# Patient Record
Sex: Female | Born: 1965
Health system: Southern US, Community
[De-identification: ages and names within clinical notes are randomized; demographics above are authoritative.]

## PROBLEM LIST (undated history)

## (undated) DIAGNOSIS — F419 Anxiety disorder, unspecified: Secondary | ICD-10-CM

## (undated) DIAGNOSIS — R630 Anorexia: Secondary | ICD-10-CM

## (undated) DIAGNOSIS — K219 Gastro-esophageal reflux disease without esophagitis: Secondary | ICD-10-CM

## (undated) DIAGNOSIS — F32A Depression, unspecified: Secondary | ICD-10-CM

## (undated) DIAGNOSIS — F509 Eating disorder, unspecified: Secondary | ICD-10-CM

## (undated) DIAGNOSIS — M810 Age-related osteoporosis without current pathological fracture: Secondary | ICD-10-CM

## (undated) HISTORY — PX: COLECTOMY: SHX59

## (undated) HISTORY — PX: SPINE SURGERY: SHX786

## (undated) HISTORY — DX: Anxiety disorder, unspecified: F41.9

## (undated) HISTORY — DX: Age-related osteoporosis without current pathological fracture: M81.0

## (undated) HISTORY — DX: Anorexia: R63.0

## (undated) HISTORY — DX: Gastro-esophageal reflux disease without esophagitis: K21.9

## (undated) HISTORY — DX: Depression, unspecified: F32.A

## (undated) HISTORY — PX: CERVICAL FUSION: SHX112

## (undated) HISTORY — DX: Eating disorder, unspecified: F50.9

---

## 1997-02-13 DIAGNOSIS — K5939 Other megacolon: Secondary | ICD-10-CM | POA: Insufficient documentation

## 1998-09-16 ENCOUNTER — Encounter: Payer: Self-pay | Admitting: Internal Medicine

## 1998-09-17 ENCOUNTER — Ambulatory Visit (HOSPITAL_COMMUNITY): Admission: RE | Admit: 1998-09-17 | Discharge: 1998-09-17 | Payer: Self-pay | Admitting: Internal Medicine

## 1999-07-02 ENCOUNTER — Encounter (INDEPENDENT_AMBULATORY_CARE_PROVIDER_SITE_OTHER): Payer: Self-pay | Admitting: *Deleted

## 1999-07-02 ENCOUNTER — Encounter: Payer: Self-pay | Admitting: Family Medicine

## 1999-07-02 ENCOUNTER — Ambulatory Visit (HOSPITAL_COMMUNITY): Admission: RE | Admit: 1999-07-02 | Discharge: 1999-07-02 | Payer: Self-pay | Admitting: Family Medicine

## 2000-11-29 ENCOUNTER — Encounter: Payer: Self-pay | Admitting: *Deleted

## 2000-11-29 ENCOUNTER — Encounter: Admission: RE | Admit: 2000-11-29 | Discharge: 2000-11-29 | Payer: Self-pay | Admitting: *Deleted

## 2001-11-28 ENCOUNTER — Other Ambulatory Visit: Admission: RE | Admit: 2001-11-28 | Discharge: 2001-11-28 | Payer: Self-pay | Admitting: Obstetrics and Gynecology

## 2002-12-05 ENCOUNTER — Other Ambulatory Visit: Admission: RE | Admit: 2002-12-05 | Discharge: 2002-12-05 | Payer: Self-pay | Admitting: Obstetrics and Gynecology

## 2002-12-08 ENCOUNTER — Encounter: Admission: RE | Admit: 2002-12-08 | Discharge: 2002-12-08 | Payer: Self-pay | Admitting: Obstetrics and Gynecology

## 2002-12-08 ENCOUNTER — Encounter: Payer: Self-pay | Admitting: Obstetrics and Gynecology

## 2003-12-10 ENCOUNTER — Other Ambulatory Visit: Admission: RE | Admit: 2003-12-10 | Discharge: 2003-12-10 | Payer: Self-pay | Admitting: Obstetrics and Gynecology

## 2004-04-07 ENCOUNTER — Ambulatory Visit (HOSPITAL_COMMUNITY): Admission: RE | Admit: 2004-04-07 | Discharge: 2004-04-07 | Payer: Self-pay | Admitting: Internal Medicine

## 2004-04-08 ENCOUNTER — Encounter: Payer: Self-pay | Admitting: Internal Medicine

## 2004-04-14 ENCOUNTER — Ambulatory Visit (HOSPITAL_COMMUNITY): Admission: RE | Admit: 2004-04-14 | Discharge: 2004-04-14 | Payer: Self-pay | Admitting: Internal Medicine

## 2004-04-14 ENCOUNTER — Encounter: Payer: Self-pay | Admitting: Internal Medicine

## 2004-04-24 ENCOUNTER — Encounter: Payer: Self-pay | Admitting: Internal Medicine

## 2004-04-24 ENCOUNTER — Ambulatory Visit (HOSPITAL_COMMUNITY): Admission: RE | Admit: 2004-04-24 | Discharge: 2004-04-24 | Payer: Self-pay | Admitting: Internal Medicine

## 2004-05-29 ENCOUNTER — Ambulatory Visit (HOSPITAL_COMMUNITY): Admission: RE | Admit: 2004-05-29 | Discharge: 2004-05-29 | Payer: Self-pay | Admitting: Internal Medicine

## 2004-05-29 ENCOUNTER — Encounter: Payer: Self-pay | Admitting: Internal Medicine

## 2004-12-08 ENCOUNTER — Encounter: Admission: RE | Admit: 2004-12-08 | Discharge: 2004-12-08 | Payer: Self-pay | Admitting: Obstetrics and Gynecology

## 2005-06-29 IMAGING — CR DG ABDOMEN ACUTE W/ 1V CHEST
3 series · 3 of 3 positions shown · non-contrast
Comparison: none

CLINICAL DATA: Low abdominal pain for one week.  Constipation for one week.  History of most of the large bowel being removed for megacolon in 3224.  
 ACUTE ABDOMEN SERIES WITH CHEST 
 Flat and erect films of the abdomen are made and show no definite obstruction or perforation of the bowel.  There is a moderate amount of fecal material within the right, left, and sigmoid areas of the colon.  No fecal impaction is seen.  No free air is seen beneath the diaphragm.  
 PA erect film of the chest shows no free air beneath the diaphragm.  No evidence of active disease within the chest.  The heart appears normal. 
 IMPRESSION
 Moderate amounts of fecal material in right, left, and sigmoid colon.  No definite fecal impaction, obstruction, or perforation is seen.  No acute disease in the chest.

[view not recorded (1 of 3)]
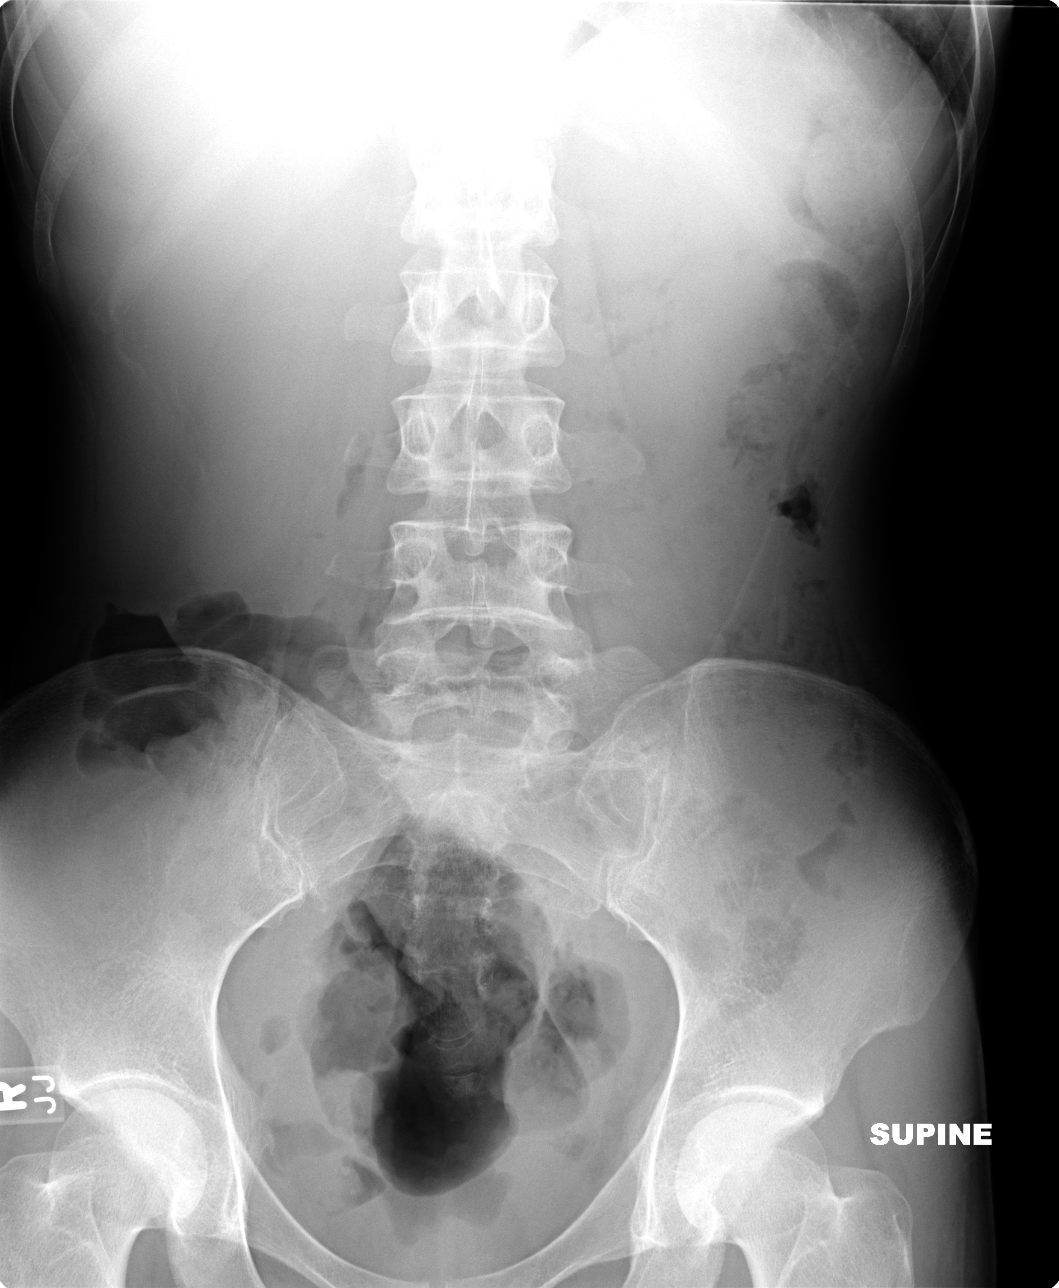

[view not recorded (2 of 3)]
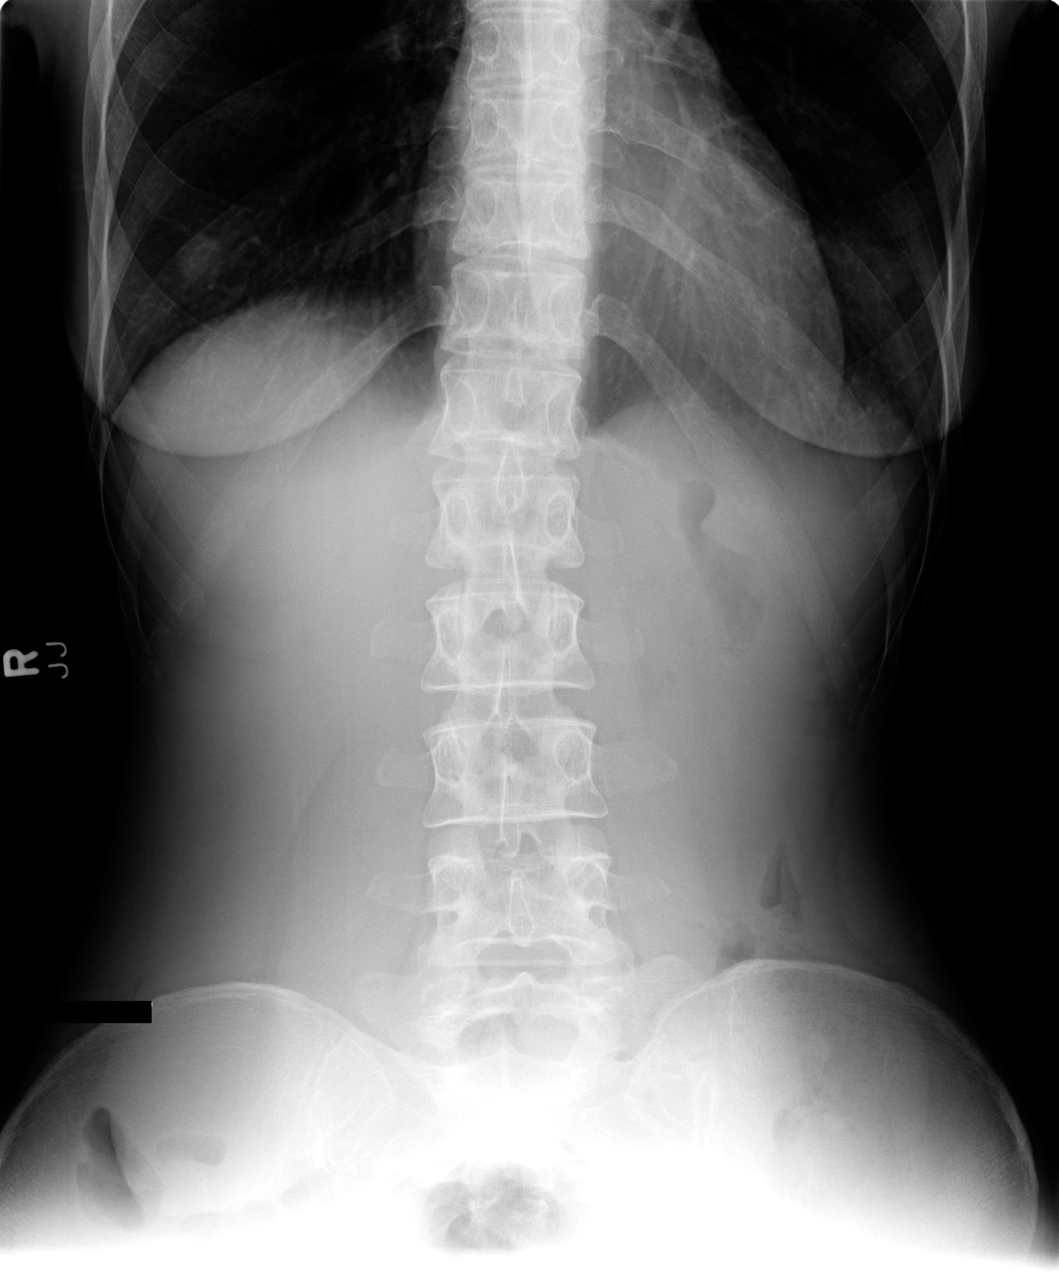

[view not recorded (3 of 3)]
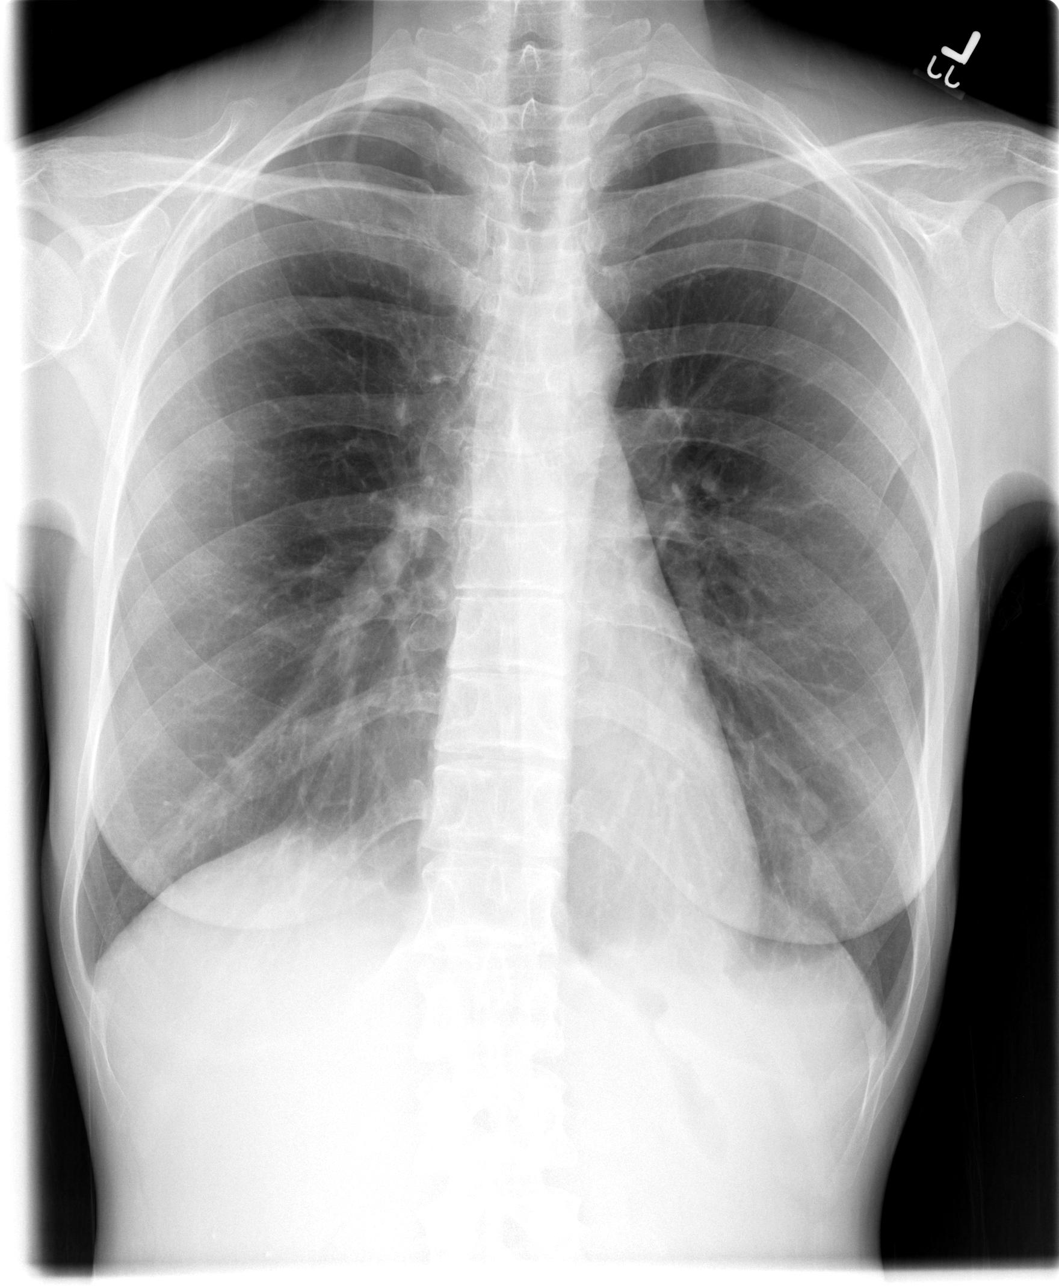

[3 of 3 positions shown; findings below may reference images not displayed]

## 2005-07-06 IMAGING — CR DG ABDOMEN 1V
1 series · 1 of 1 positions shown · non-contrast
Comparison: none

CLINICAL DATA: constipation; Sitz marker capsule followup
 ONE VIEW ABDOMEN 04/14/04.  There are no retained sitz markers seen within the abdomen.  There is retained stool seen within the rectosigmoid colon and the bowel gas pattern is normal.
 IMPRESSION
 No retained sitz markers.

[view not recorded]
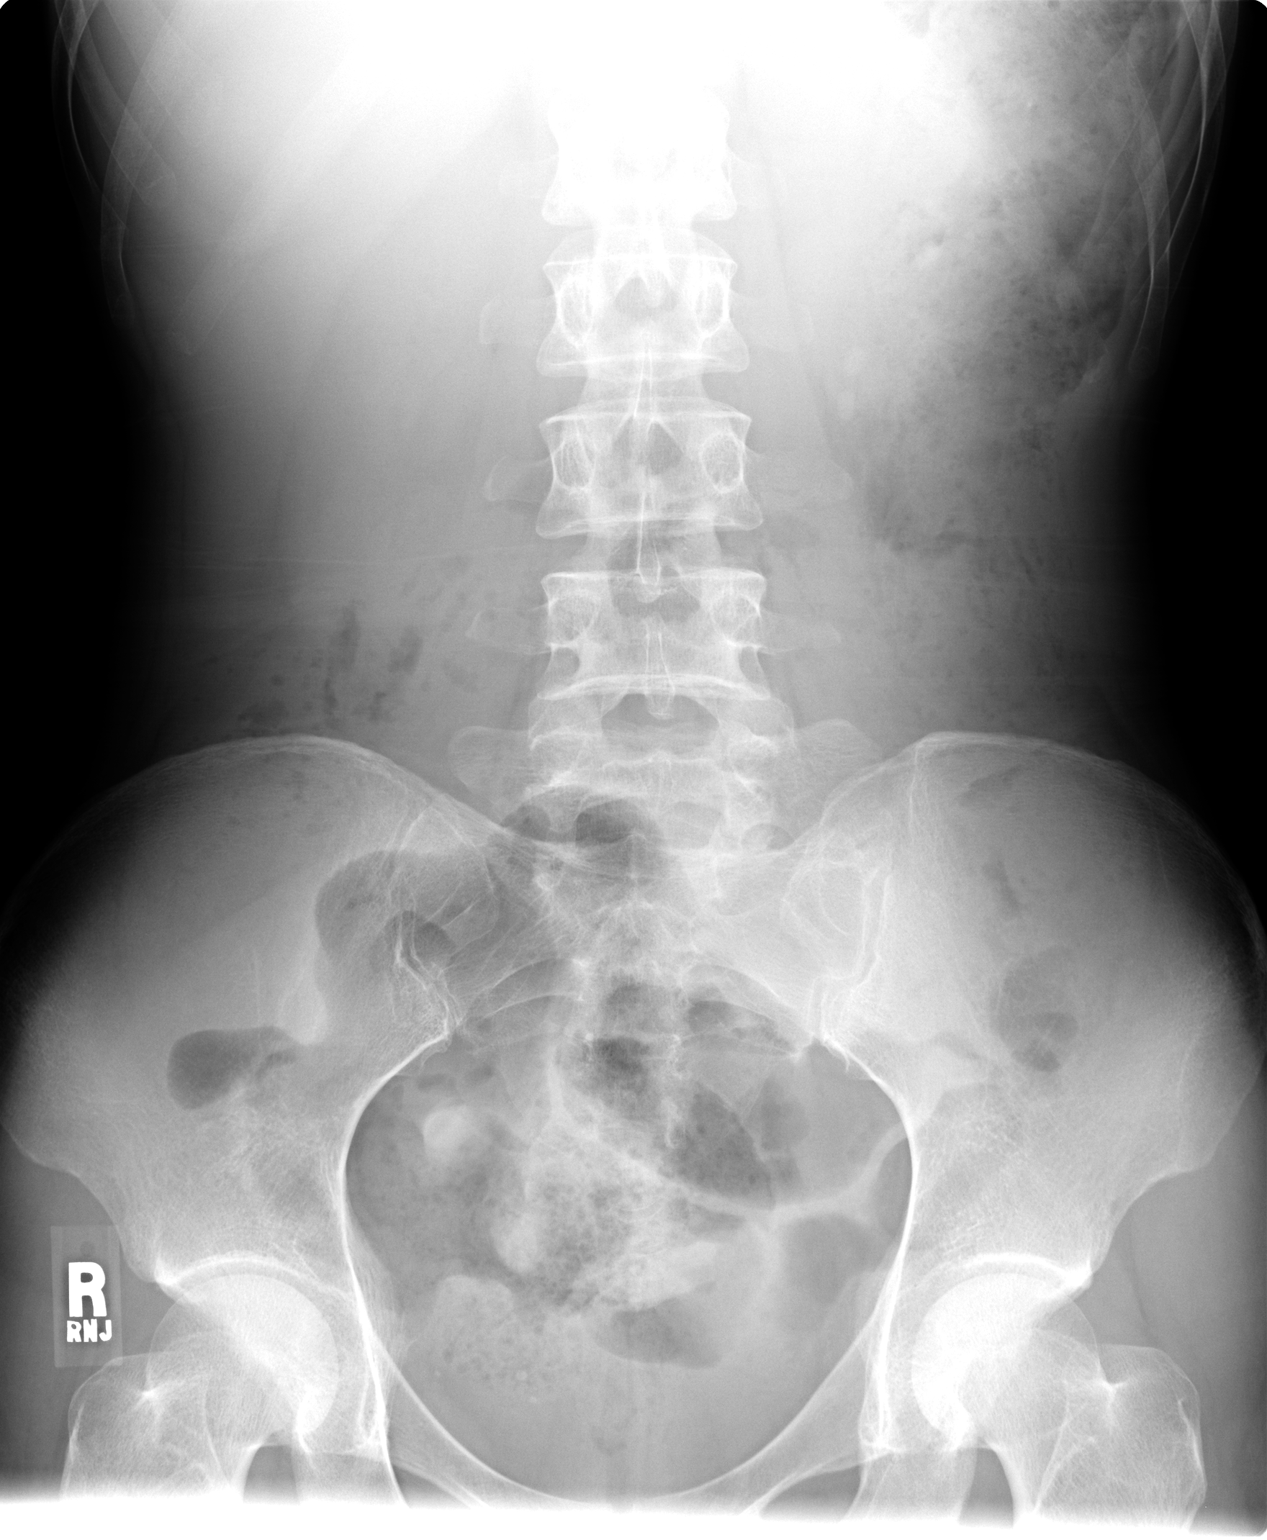

[1 of 1 positions shown; findings below may reference images not displayed]

## 2005-12-17 ENCOUNTER — Encounter: Admission: RE | Admit: 2005-12-17 | Discharge: 2005-12-17 | Payer: Self-pay | Admitting: *Deleted

## 2006-04-02 ENCOUNTER — Ambulatory Visit: Payer: Self-pay | Admitting: Internal Medicine

## 2007-07-04 ENCOUNTER — Ambulatory Visit: Payer: Self-pay | Admitting: Internal Medicine

## 2008-03-30 ENCOUNTER — Encounter (INDEPENDENT_AMBULATORY_CARE_PROVIDER_SITE_OTHER): Payer: Self-pay | Admitting: Family Medicine

## 2008-03-30 ENCOUNTER — Ambulatory Visit: Payer: Self-pay | Admitting: Vascular Surgery

## 2008-03-30 ENCOUNTER — Ambulatory Visit (HOSPITAL_COMMUNITY): Admission: RE | Admit: 2008-03-30 | Discharge: 2008-03-30 | Payer: Self-pay | Admitting: Family Medicine

## 2008-04-09 DIAGNOSIS — F5000 Anorexia nervosa, unspecified: Secondary | ICD-10-CM | POA: Insufficient documentation

## 2008-05-11 ENCOUNTER — Ambulatory Visit: Payer: Self-pay | Admitting: Internal Medicine

## 2008-05-11 ENCOUNTER — Telehealth: Payer: Self-pay | Admitting: Internal Medicine

## 2008-05-11 DIAGNOSIS — K219 Gastro-esophageal reflux disease without esophagitis: Secondary | ICD-10-CM | POA: Insufficient documentation

## 2008-05-11 DIAGNOSIS — M810 Age-related osteoporosis without current pathological fracture: Secondary | ICD-10-CM | POA: Insufficient documentation

## 2008-05-11 DIAGNOSIS — Z8679 Personal history of other diseases of the circulatory system: Secondary | ICD-10-CM | POA: Insufficient documentation

## 2008-05-11 DIAGNOSIS — K6289 Other specified diseases of anus and rectum: Secondary | ICD-10-CM | POA: Insufficient documentation

## 2008-05-11 DIAGNOSIS — K5900 Constipation, unspecified: Secondary | ICD-10-CM | POA: Insufficient documentation

## 2008-05-11 DIAGNOSIS — Z8659 Personal history of other mental and behavioral disorders: Secondary | ICD-10-CM | POA: Insufficient documentation

## 2008-05-15 ENCOUNTER — Telehealth: Payer: Self-pay | Admitting: Internal Medicine

## 2008-05-23 ENCOUNTER — Telehealth: Payer: Self-pay | Admitting: Internal Medicine

## 2008-10-29 ENCOUNTER — Telehealth: Payer: Self-pay | Admitting: Internal Medicine

## 2009-04-22 ENCOUNTER — Telehealth: Payer: Self-pay | Admitting: Internal Medicine

## 2009-08-06 ENCOUNTER — Telehealth: Payer: Self-pay | Admitting: Internal Medicine

## 2009-08-15 ENCOUNTER — Telehealth: Payer: Self-pay | Admitting: Internal Medicine

## 2009-10-03 ENCOUNTER — Telehealth: Payer: Self-pay | Admitting: Internal Medicine

## 2009-11-08 ENCOUNTER — Encounter: Admission: RE | Admit: 2009-11-08 | Discharge: 2009-11-08 | Payer: Self-pay | Admitting: Family Medicine

## 2009-11-25 ENCOUNTER — Encounter: Admission: RE | Admit: 2009-11-25 | Discharge: 2009-11-25 | Payer: Self-pay | Admitting: Neurosurgery

## 2009-12-04 ENCOUNTER — Ambulatory Visit (HOSPITAL_COMMUNITY): Admission: RE | Admit: 2009-12-04 | Discharge: 2009-12-05 | Payer: Self-pay | Admitting: Neurosurgery

## 2009-12-27 ENCOUNTER — Telehealth: Payer: Self-pay | Admitting: Internal Medicine

## 2010-01-06 ENCOUNTER — Encounter: Payer: Self-pay | Admitting: Internal Medicine

## 2010-01-13 ENCOUNTER — Encounter: Payer: Self-pay | Admitting: Internal Medicine

## 2010-01-17 ENCOUNTER — Encounter: Payer: Self-pay | Admitting: Internal Medicine

## 2010-02-11 ENCOUNTER — Encounter: Admission: RE | Admit: 2010-02-11 | Discharge: 2010-02-11 | Payer: Self-pay | Admitting: Neurosurgery

## 2010-04-28 ENCOUNTER — Telehealth: Payer: Self-pay | Admitting: Internal Medicine

## 2010-05-05 ENCOUNTER — Ambulatory Visit: Payer: Self-pay | Admitting: Internal Medicine

## 2010-05-06 LAB — CONVERTED CEMR LAB
Albumin: 4.2 g/dL (ref 3.5–5.2)
BUN: 18 mg/dL (ref 6–23)
Chloride: 93 meq/L — ABNORMAL LOW (ref 96–112)
Creatinine, Ser: 0.6 mg/dL (ref 0.4–1.2)
GFR calc non Af Amer: 121.72 mL/min (ref >60)
Glucose, Bld: 78 mg/dL (ref 70–99)
Phosphorus: 3.5 mg/dL (ref 2.3–4.6)
Vitamin B-12: >1500 pg/mL — ABNORMAL HIGH (ref 211–911)

## 2010-05-14 ENCOUNTER — Encounter: Payer: Self-pay | Admitting: Internal Medicine

## 2010-06-25 ENCOUNTER — Encounter (INDEPENDENT_AMBULATORY_CARE_PROVIDER_SITE_OTHER): Payer: Self-pay | Admitting: *Deleted

## 2010-09-25 ENCOUNTER — Telehealth: Payer: Self-pay | Admitting: Internal Medicine

## 2010-09-25 ENCOUNTER — Encounter: Payer: Self-pay | Admitting: Internal Medicine

## 2010-09-26 ENCOUNTER — Encounter (INDEPENDENT_AMBULATORY_CARE_PROVIDER_SITE_OTHER): Payer: Self-pay | Admitting: *Deleted

## 2010-09-30 ENCOUNTER — Ambulatory Visit: Payer: Self-pay | Admitting: Internal Medicine

## 2010-10-07 ENCOUNTER — Ambulatory Visit: Payer: Self-pay | Admitting: Internal Medicine

## 2010-10-10 ENCOUNTER — Telehealth: Payer: Self-pay | Admitting: Internal Medicine

## 2010-11-18 ENCOUNTER — Ambulatory Visit: Payer: Self-pay | Admitting: Internal Medicine

## 2010-11-18 DIAGNOSIS — K648 Other hemorrhoids: Secondary | ICD-10-CM | POA: Insufficient documentation

## 2010-12-19 ENCOUNTER — Encounter: Payer: Self-pay | Admitting: Internal Medicine

## 2010-12-23 ENCOUNTER — Ambulatory Visit
Admission: RE | Admit: 2010-12-23 | Discharge: 2010-12-23 | Payer: Self-pay | Source: Home / Self Care | Attending: Internal Medicine | Admitting: Internal Medicine

## 2010-12-23 ENCOUNTER — Telehealth: Payer: Self-pay | Admitting: Internal Medicine

## 2010-12-23 ENCOUNTER — Other Ambulatory Visit: Payer: Self-pay | Admitting: Internal Medicine

## 2010-12-23 LAB — RENAL FUNCTION PANEL
Albumin: 4.3 g/dL (ref 3.5–5.2)
BUN: 15 mg/dL (ref 6–23)
CO2: 29 mEq/L (ref 19–32)
Calcium: 10.1 mg/dL (ref 8.4–10.5)
Chloride: 94 mEq/L — ABNORMAL LOW (ref 96–112)
Creatinine, Ser: 0.4 mg/dL (ref 0.4–1.2)
GFR: 168.03 mL/min (ref >60.00)
Glucose, Bld: 81 mg/dL (ref 70–99)
Phosphorus: 3.3 mg/dL (ref 2.3–4.6)
Potassium: 6 mEq/L — ABNORMAL HIGH (ref 3.5–5.1)
Sodium: 131 mEq/L — ABNORMAL LOW (ref 135–145)

## 2010-12-23 LAB — MAGNESIUM: Magnesium: 2 mg/dL (ref 1.5–2.5)

## 2010-12-24 ENCOUNTER — Ambulatory Visit
Admission: RE | Admit: 2010-12-24 | Discharge: 2010-12-24 | Payer: Self-pay | Source: Home / Self Care | Attending: Internal Medicine | Admitting: Internal Medicine

## 2010-12-24 ENCOUNTER — Other Ambulatory Visit: Payer: Self-pay | Admitting: Internal Medicine

## 2010-12-24 ENCOUNTER — Telehealth (INDEPENDENT_AMBULATORY_CARE_PROVIDER_SITE_OTHER): Payer: Self-pay | Admitting: *Deleted

## 2010-12-24 LAB — RENAL FUNCTION PANEL
Albumin: 3.9 g/dL (ref 3.5–5.2)
BUN: 15 mg/dL (ref 6–23)
CO2: 31 mEq/L (ref 19–32)
Calcium: 9.2 mg/dL (ref 8.4–10.5)
Chloride: 93 mEq/L — ABNORMAL LOW (ref 96–112)
Creatinine, Ser: 0.7 mg/dL (ref 0.4–1.2)
GFR: 94.2 mL/min (ref >60.00)
Glucose, Bld: 82 mg/dL (ref 70–99)
Phosphorus: 2.9 mg/dL (ref 2.3–4.6)
Potassium: 4 mEq/L (ref 3.5–5.1)
Sodium: 129 mEq/L — ABNORMAL LOW (ref 135–145)

## 2010-12-26 ENCOUNTER — Telehealth: Payer: Self-pay | Admitting: Internal Medicine

## 2011-01-20 ENCOUNTER — Other Ambulatory Visit: Payer: Self-pay | Admitting: Obstetrics and Gynecology

## 2011-01-20 DIAGNOSIS — Z1239 Encounter for other screening for malignant neoplasm of breast: Secondary | ICD-10-CM

## 2011-01-20 DIAGNOSIS — Z1231 Encounter for screening mammogram for malignant neoplasm of breast: Secondary | ICD-10-CM

## 2011-01-22 NOTE — Miscellaneous (Signed)
Summary: Amitiza Refills  Clinical Lists Changes  Medications: Rx of AMITIZA 24 MCG  CAPS (LUBIPROSTONE) Take 1 tablet by mouth two times a day;  #180 x 0;  Signed;  Entered by: Lamona Curl CMA (AAMA);  Authorized by: Hart Carwin MD;  Method used: Electronically to [facility] , ,   , Ph: [phone], Fax: [phone]    Prescriptions: AMITIZA 24 MCG  CAPS (LUBIPROSTONE) Take 1 tablet by mouth two times a day  #180 x 0   Entered by:   Lamona Curl CMA (AAMA)   Authorized by:   Hart Carwin MD   Signed by:   Lamona Curl CMA (AAMA) on 05/14/2010   Method used:   Electronically to        [facility]* (mail-order)             ,          Ph: [phone]       Fax: [phone]   RxID:   [id number]

## 2011-01-22 NOTE — Assessment & Plan Note (Signed)
Summary: FOLLOW-UP ON TRIAGE 04-28-10            Andrea Wright   History of Present Illness Visit Type: Follow-up Visit Primary GI MD: Lina Sar MD Primary Provider: Dr. Catha Gosselin Requesting Provider: n/a Chief Complaint: generalized abdominal pain and bloating.  Abd pain has subsided for the last few days History of Present Illness:   This is a 45 year old white female with the eating disorder of anorexia, chronic constipation and megacolon. She is status post subtotal colectomy in 1998. Her last sigmoidoscopy was in 2005. She had an acute episode of abdominal pain suggestive a bowel obstruction which resolved spontaneously on bowel rest. She is on a regular anti-constipation regimen which includes MiraLax 17 g daily, Amitiza 24 mcg twice a day, saline enemas once a week. She was evaluated at [facility] in 2007 and gained up to 132 pounds. She is back down 111 pounds today. Patient underwent an incision and evacuation of a thrombosed hemorrhoid by Dr.Wyatt on 04/21/10. Her abdominal pain started 3 days later after she took Vicodin for pain.   GI Review of Systems    Reports abdominal pain, bloating, and  nausea.     Location of  Abdominal pain: generalized.    Denies acid reflux, belching, chest pain, dysphagia with liquids, dysphagia with solids, heartburn, loss of appetite, vomiting, vomiting blood, weight loss, and  weight gain.        Denies anal fissure, black tarry stools, change in bowel habit, constipation, diarrhea, diverticulosis, fecal incontinence, heme positive stool, hemorrhoids, irritable bowel syndrome, jaundice, light color stool, liver problems, rectal bleeding, and  rectal pain. Preventive Screening-Counseling & Management  Alcohol-Tobacco     Smoking Status: never    Current Medications (verified): 1)  Vitamin D 1000 Unit  Tabs (Cholecalciferol) .... Take 1 Tablet By Mouth 1 Time Per Day 2)  Vitamin K .... Take 1 Tablet By Mouth Once A  Day 3)  Magnesium Oxide 400 Mg  Caps (Magnesium Oxide) .... Take 1 Tablet By Mouth Once A Day 4)  Fish Oil   Oil (Fish Oil) .... Take 1 Tablet By Mouth Once A Day 5)  Glucosamine-Chondroitin 750-600 Mg Tabs (Glucosamine-Chondroitin) .... Take 1 Tablet By Mouth Once A Day 6)  Selenium 200 Mcg  Tabs (Selenium) .... Take 1 Tablet By Mouth Once A Day 7)  Vitamin E Blend 200 Unit  Caps (Vitamin E) .... Take 1 Tablet By Mouth Once A Day 8)  Deplin 7.5 Mg  Tabs (L-Methylfolate) .... Take 1 Tablet By Mouth Once A Day 9)  Amitiza 24 Mcg  Caps (Lubiprostone) .... Take 1 Tablet By Mouth Two Times A Day 10)  Miralax   Powd (Polyethylene Glycol 3350) .... Take 1 Scoop Dissolved in At Least 8 Ounces of Water Daily 11)  Biotin 1000 Mcg  Tabs (Biotin) .... Take 1 Tablet By Mouth Once A Day 12)  Multivitamins   Tabs (Multiple Vitamin) .... Take 1 Tablet By Mouth Once A Day 13)  Wellbutrin Sr 150 Mg  Tb12 (Bupropion Hcl) .... 3 Tablets Once Daily 14)  Prozac 20 Mg  Caps (Fluoxetine Hcl) .... 3 Tablets Once Daily 15)  Anusol-Hc 25 Mg  Supp (Hydrocortisone Acetate) .... Insert 1 Suppository Into Rectum At Bedtime 16)  Phillips Milk of Magnesia 7.75 % Susp (Magnesium Hydroxide) .... Take 45 Cc By Mouth Once Daily 17)  Ra Antacid/anti-Gas 200-200-20 Mg/39ml Susp (Alum & Mag Hydroxide-Simeth) .... Use As Directed 18)  Probiotic  Caps (Probiotic Product) .... Take 1 Tablet By Mouth Once A Day 19)  Ranitidine Hcl 150 Mg Tabs (Ranitidine Hcl) .... Take 1 Tablet By Mouth Once A Day 20)  Boniva 150 Mg Tabs (Ibandronate Sodium) .... Take One Tablet By Mouth Once Monthly 21)  Trazodone Hcl 100 Mg Tabs (Trazodone Hcl) .... Take 2 Tablets By Mouth Daily 22)  Ginger 500 Mg Caps (Ginger (Zingiber Officinalis)) .... Take 1 Capsule By Mouth Two Times A Day 23)  Msm 500 Mg Caps (Methylsulfonylmethane) .... Take 1 Capsule By Mouth Once A Day 24)  B Complex  Tabs (B Complex Vitamins) .... Take 1 Tablet By Mouth Once A Day 25)   Calcium 500 Mg Tabs (Calcium) .... Take 1 Tablet By Mouth Once A Day 26)  Lactaid 3000 Unit Tabs (Lactase) .... Take 2 Tablets By Mouth Daily 27)  Mylanta 200-200-20 Mg/24ml Susp (Alum & Mag Hydroxide-Simeth) .... As Needed 28)  Ferrous Sulfate 325 (65 Fe) Mg Tabs (Ferrous Sulfate) 29)  Vitamin C 500 Mg Tabs (Ascorbic Acid) 30)  Vitamin E 400 Unit Caps (Vitamin E)  Allergies (verified): 1)  ! * Ambien Cr  Past History:  Past Medical History: Reviewed history from 04/09/2008 and no changes required. Current Problems:  ANOREXIA, HX OF (ICD-V15.89) MEGACOLON (ICD-564.7)  Past Surgical History: subtotal colectomy  and ileoproctostomy 1998 C-spine surgery  Family History: Family History of Heart Disease: Paternal Grandfather Family History of Colitis/Crohn's:Aunt Family History of Liver Disease: Maternal Grandmother Family History of Pancreatic Cancer: MGF No FH of Colon Cancer:  Social History: Occupation: IT sales professional Alcohol Use - yes Illicit Drug Use - no Patient gets regular exercise. Patient has never smoked.  Daily Caffeine Use coffee/tea Smoking Status:  never  Review of Systems  The patient denies allergy/sinus, anemia, anxiety-new, arthritis/joint pain, back pain, blood in urine, breast changes/lumps, confusion, cough, coughing up blood, depression-new, fainting, fatigue, fever, headaches-new, hearing problems, heart murmur, heart rhythm changes, itching, menstrual pain, muscle pains/cramps, night sweats, nosebleeds, pregnancy symptoms, shortness of breath, skin rash, sleeping problems, sore throat, swelling of feet/legs, swollen lymph glands, thirst - excessive, urination - excessive, urination changes/pain, urine leakage, vision changes, and voice change.         Pertinent positive and negative review of systems were noted in the above HPI. All other ROS was otherwise negative.   Vital Signs:  Patient profile:   45 year old female Height:      69  inches Weight:      111 pounds BMI:     16.45 Pulse rate:   68 / minute Pulse rhythm:   regular BP sitting:   124 / 76  (left arm) Cuff size:   regular  Vitals Entered By: Francee Piccolo CMA Duncan Dull) (May 05, 2010 4:34 PM)  Physical Exam  General:  cachectic. ill appearing, alert and oriented. Eyes:  PERRLA, no icterus. Mouth:  No deformity or lesions, dentition normal. Lungs:  Clear throughout to auscultation. Heart:  Regular rate and rhythm; no murmurs, rubs,  or bruits. Abdomen:  scaphoid abdomen with normal active bowel sounds. No distention. No tympany. No tenderness. There is no palpable stool. Rectal:  small external hemorrhoidal tags, normal rectal sphincter tone, stool is soft Hemoccult negative. There is no tenderness or bleeding. Extremities:  No clubbing, cyanosis, edema or deformities noted. Neurologic:  Alert and oriented x4; grossly normal neurologically. Skin:  Intact without significant lesions or rashes. Psych:  Alert and cooperative. Normal mood and affect.  Impression & Recommendations:  Problem # 1:  ANOREXIA, HX OF (ICD-V15.89) Patient has anorexia and eating disorder. She is currently down to 111 pounds. She denies skipping meals. We will check her pre-albumin, tissue transglutaminase and B12 levels as well as potassium. I encouraged her to call us if she develops the abdominal pain again which suggests a partial small bowel obstruction. In general, she needs to split her MiraLax and take 2 doses to prevent distention of the small bowel. She is due for a repeat sigmoidoscopy as her last exam was done 6 years ago. We need to rule out an anastomotic stricture.  Problem # 2:  RECTAL PAIN (YNW-295.62) Patient is status post evacuation of a thrombosed hemorrhoid by Dr Lindie Spruce, which is currently under good control. Orders: T-Tissue Transglutamase Ab IgA [id number]) T-Prealbumin (96295) TLB-B12, Serum-Total ONLY (28413-K44) TLB-Renal Function Panel  (80069-RENAL)  Problem # 3:  Hx of PSUEDO OBSTRUCTION (ICD-556.6) Patient is status post subtotal colectomy in 1999. Orders: T-Tissue Transglutamase Ab IgA [phone]) T-Prealbumin (44034) TLB-B12, Serum-Total ONLY (74259-D63) TLB-Renal Function Panel (80069-RENAL)  Patient Instructions: 1)  high-fiber diet. 2)  Tissue transglutaminase, prealbumin, renal profile, B12. 3)  MiraLax 17 g daily. 4)  Continue all other laxatives. 5)  Flexible sigmoidoscopy. 6)  Office visit 6 months. 7)  Copy sent to :Dr Wyline Beady 8)  The medication list was reviewed and reconciled.  All changed / newly prescribed medications were explained.  A complete medication list was provided to the patient / caregiver.  Appended Document: FOLLOW-UP ON TRIAGE 04-28-10            Andrea Wright I have contacted the patient about scheduling a flexible sigmoidoscopy (as recommended by Dr Juanda Chance). She states that she will have to call back once she gets her transportation arranged. I have asked her to make sure that she schedules the procedure and I will continue to follow up with her regarding this if I have not gotten a return call back from her shortly.  Appended Document: FOLLOW-UP ON TRIAGE 04-28-10            [facility] Spoke to patient regarding scheduling a flexible sigmoidoscopy. Patient again states "I will have to call you back." I will continue to follow up on this.  Appended Document: FOLLOW-UP ON TRIAGE 04-28-10            [facility] ---- 05/23/2010 8:24 AM, Karen Kitchens Nelson-Smith CMA (AAMA) wrote: Dr Leonard SchwartzLorain Childes.Marland KitchenMarland KitchenMarland KitchenMarland Kitchenpatient still has not scheduled Flex Sig  ---- 05/23/2010 1:42 PM, Hart Carwin MD wrote: please send her recall letter for Flex sig first week in August, 2011.  Recall for August 2011 has been entered into [employer].

## 2011-01-22 NOTE — Medication Information (Signed)
Summary: Scripts for pt/[facility]  Scripts for pt/[facility]   Imported By: Lester [place] 01/22/2010 08:30:50  _____________________________________________________________________  External Attachment:    Type:   Image     Comment:   External Document

## 2011-01-22 NOTE — Progress Notes (Signed)
Summary: Triage   Phone Note Call from Patient Call back at Home Phone [phone]   Caller: Patient Call For: Dr. Juanda Chance Reason for Call: Talk to Nurse Summary of Call: Pt needs to speak with nurse about she did not want to tell me about what Initial call taken by: [place] Johnson,  December 23, 2010 11:52 AM  Follow-up for Phone Call        Patient calling to report she has not had a "solid BM" since last Tuesday. She has increased Miralax to two times a day, Magnesium Citirate x 2 bottles, mineral oil, Amitiza  and water enemas. States she is eating prunes and tried prune lax. States she only passes brown liquid with enemas. Stomach is distended and uncomfortable. She is passing gas. No appetite and feels weak. States she is eating high fiber. Dr. Juanda Chance aware and ordered KUB and lab- renal profile, Magnesium level. Patient will come on in for these today. Follow-up by: Jesse Fall RN,  December 23, 2010 2:26 PM

## 2011-01-22 NOTE — Progress Notes (Signed)
Summary: TRIAGE-Constipation   Phone Note Call from Patient Call back at [phone]   Caller: Patient Call For: Dr.Becki Mccaskill Summary of Call: Patient would like to discuss constipation.  Initial call taken by: Harlow Mares CMA Duncan Dull),  December 27, 2009 3:02 PM  Follow-up for Phone Call        Pt. c/o constipation, passes a small amt of BM daily, but feels like she doesn't empty completly. Does have some abd. cramping. Took a mineral oil enema yesterday. Took mineral oil & MagCitrate at 2pm today. Takes Miralax 17gm daily & Amitiza two times a day. Uses saline enema 1x weekly.  Denies fever, n/v, blood, black stools.  Wants to know if Dr.Jeroline Wolbert has anything more to offer to help w/constipation.  DR.Tamitha Norell PLEASE ADVISE   Follow-up by: Laureen Ochs LPN,  December 27, 2009 3:18 PM  Additional Follow-up for Phone Call Additional follow up Details #1::         I agree with mag. citrate as a last resort. Does she need  KUB to localize the stool impaction. (If present) Fleets enema. MOM 45 cc by mouth once daily in addition to Miralax. Additional Follow-up by: Hart Carwin MD,  December 27, 2009 10:27 PM    Additional Follow-up for Phone Call Additional follow up Details #2::    Above MD orders reviewed with patient. She is passing more stool, doesn't feel constipated at this time. Pt. instructed to call back as needed.  Follow-up by: Laureen Ochs LPN,  December 30, 2009 9:58 AM

## 2011-01-22 NOTE — Procedures (Signed)
Summary: Flexible Sigmoidoscopy  Patient: Andrea Wright Note: All result statuses are Final unless otherwise noted.  Tests: (1) Flexible Sigmoidoscopy (FLX)  FLX Flexible Sigmoidoscopy                             DONE     [facility]     [address]     [place], [place]  [address]           FLEXIBLE SIGMOIDOSCOPY PROCEDURE REPORT           PATIENT:  [name], [name]  MR#:  [id number]     BIRTHDATE:  02/22/1966, 44 yrs. old  GENDER:  female           ENDOSCOPIST:  Hedwig Morton. Juanda Chance, MD     Referred by:  Catha Gosselin, M.D.           PROCEDURE DATE:  10/07/2010     PROCEDURE:  Flexible Sigmoidoscopy, diagnostic     ASA CLASS:  Class II     INDICATIONS:  change in bowel habits, constipation colonic     inertia, s/p subtotal colectomy 1998 with persistant constipation           last exam 2005     pt on Miralax           MEDICATIONS:   Versed 6 mg, Fentanyl 50 mcg           DESCRIPTION OF PROCEDURE:   After the risks benefits and     alternatives of the procedure were thoroughly explained, informed     consent was obtained.  Digital rectal exam was performed and     revealed no rectal masses.   The [id number] endoscope     was introduced through the anus and advanced to the sigmoid colon,     without limitations.  The quality of the prep was poor.  The     instrument was then slowly withdrawn as the mucosa was fully     examined.     <<PROCEDUREIMAGES>>           A postop change was noted rectum to sigmoid s/p subtotal colectomy     with 20 cm remaining colon which appears normal, slightly dilated     Internal hemorrhoids were found (see image5).  The examination was     otherwise normal (see image4, image3, image2, and image1). large     amount of retained stool   Retroflexed views in the rectum     revealed no abnormalities.    The scope was then withdrawn from     the patient and the procedure terminated.           COMPLICATIONS:  None           ENDOSCOPIC  IMPRESSION:     1) Postop change in the rectum to sigmoid     2) Internal hemorrhoids     3) Otherwise normal examination.     wide open ileocolic anastomosis at 20 cm, normal small bowl to     70 cm     RECOMMENDATIONS:     increase Miralax to 17 gm daily           REPEAT EXAM:  In 10 year(s) for.           ______________________________     Hedwig Morton. Juanda Chance, MD           CC:  n.     eSIGNED:   Hedwig Morton. Ankith Edmonston at 10/07/2010 12:00 PM           [place], [place], [id number]  Note: An exclamation mark (!) indicates a result that was not dispersed into the flowsheet. Document Creation Date: 10/07/2010 12:05 PM _______________________________________________________________________  (1) Order result status: Final Collection or observation date-time: 10/07/2010 11:49 Requested date-time:  Receipt date-time:  Reported date-time:  Referring Physician:   Ordering Physician: Lina Sar [id number]) Specimen Source:  Source: Launa Grill Order Number: [id number] Lab site:   Appended Document: Flexible Sigmoidoscopy     Flexible Sigmoidoscopy  Procedure date:  10/07/2010  Comments:      Location: [facility].   Procedures Next Due Date:    Flexible Sigmoidoscopy: 09/2020

## 2011-01-22 NOTE — Letter (Signed)
Summary: Previsit letter  [facility]  [address] [place], [place] [address]   Phone: [phone]  Fax: [phone]       09/25/2010 MRN: [id number]  [facility] [address] [place], [place]  [address]  Dear Andrea Wright,  Welcome to the Gastroenterology Division at [facility].    You are scheduled to see a nurse for your pre-procedure visit on 09/30/10  at 2:00 p.m.  on the 3rd floor at [facility], [address]  We ask that you try to arrive at our office 15 minutes prior to your appointment time to allow for check-in.  Your nurse visit will consist of discussing your medical and surgical history, your immediate family medical history, and your medications.    Please bring a complete list of all your medications or, if you prefer, bring the medication bottles and we will list them.  We will need to be aware of both prescribed and over the counter drugs.  We will need to know exact dosage information as well.  If you are on blood thinners (Coumadin, Plavix, Aggrenox, Ticlid, etc.) please call our office today/prior to your appointment, as we need to consult with your physician about holding your medication.   Please be prepared to read and sign documents such as consent forms, a financial agreement, and acknowledgement forms.  If necessary, and with your consent, a friend or relative is welcome to sit-in on the nurse visit with you.  Please bring your insurance card so that we may make a copy of it.  If your insurance requires a referral to see a specialist, please bring your referral form from your primary care physician.  No co-pay is required for this nurse visit.     If you cannot keep your appointment, please call [phone] to cancel or reschedule prior to your appointment date.  This allows [place] the opportunity to schedule an appointment for another patient in need of care.    Thank you for choosing [facility] for your medical  needs.  We appreciate the opportunity to care for you.  Please visit [place] at our website  to learn more about our practice.                     Sincerely.                                                                                                                   The Gastroenterology Division

## 2011-01-22 NOTE — Letter (Signed)
Summary: [facility]  [address] [place], [place] [address]   Phone: [phone]  Fax: [phone]       Andrea Wright    [birth year]    MRN: [id number]        Procedure Day /Date:  Tuesday 10/07/2010     Arrival Time: 10:00 am      Procedure Time:  11:00 am     Location of Procedure:                    _x _  [facility] (4th Floor)    PREPARATION FOR FLEXIBLE SIGMOIDOSCOPY WITH MAGNESIUM CITRATE  Prior to the day before your procedure, purchase one 8 oz. bottle of Magnesium Citrate and one Fleet Enema from the laxative section of your drugstore.  _________________________________________________________________________________________________  THE DAY BEFORE YOUR PROCEDURE             DATE: Monday 10/17  1.   Have a clear liquid dinner the night before your procedure.  2.   Do not drink anything colored red or purple.  Avoid juices with pulp.  No orange juice.              CLEAR LIQUIDS INCLUDE: Water Jello Ice Popsicles Tea (sugar ok, no milk/cream) Powdered fruit flavored drinks Coffee (sugar ok, no milk/cream) Gatorade Juice: apple, white grape, white cranberry  Lemonade Clear bullion, consomm, broth Carbonated beverages (any kind) Strained chicken noodle soup Hard Candy   3.   At 7:00 pm the night before your procedure, drink one bottle of Magnesium Citrate over ice.  4.   Drink at least 3 more glasses of clear liquids before bedtime (preferably juices).  5.   Results are expected usually within 1 to 6 hours after taking the Magnesium Citrate.  ___________________________________________________________________________________________________  THE DAY OF YOUR PROCEDURE            DATE: Tuesday 10/18  1.   Use Fleet Enema one hour prior to coming for procedure.  2.   You may drink clear liquids until 9:00 am (2 hours before exam)       MEDICATION INSTRUCTIONS  Unless otherwise instructed, you  should take regular prescription medications with a small sip of water as early as possible the morning of your procedure.           OTHER INSTRUCTIONS  You will need a responsible adult at least 46 years of age to accompany you and drive you home.   This person must remain in the waiting room during your procedure.  Wear loose fitting clothing that is easily removed.  Leave jewelry and other valuables at home.  However, you may wish to bring a book to read or an iPod/MP3 player to listen to music as you wait for your procedure to start.  Remove all body piercing jewelry and leave at home.  Total time from sign-in until discharge is approximately 2-3 hours.  You should go home directly after your procedure and rest.  You can resume normal activities the day after your procedure.  The day of your procedure you should not:   Drive   Make legal decisions   Operate machinery   Drink alcohol   Return to work  You will receive specific instructions about eating, activities and medications before you leave.   The above instructions have been reviewed and explained to me by   Ezra Sites RN  September 30, 2010 2:33 PM  I fully understand and can verbalize these instructions _____________________________ Date _________

## 2011-01-22 NOTE — Assessment & Plan Note (Signed)
Summary: ROV per Dr. Delfino Lovett    History of Present Illness Visit Type: Follow-up Visit Primary GI MD: Lina Sar MD Primary Provider: Dr. Catha Gosselin Requesting Provider: n/a Chief Complaint: F/u from flex sig. Pt denies any GI complaints  History of Present Illness:   This is a 45 year old white female with colonic inertia who is status post subtotal colectomy in 1998. She is status post recent sigmoidoscopy showing a normal wide-open illeocolic anastomosis at 20 cm. She has an eating disorder ( anorexia ) and chronic constipation despite os subtotal colectomy.Marland Kitchen She was treated as an inpatient at [facility]  in 2007 and gained 2 0pounds. Recently pt he underwent excision of a thrombosed hemorrhoid ( May 2011) . Her laxative regimen consists of fleets enemas once a week and  MiraLax 17 g daily. Her weight has stabilized  around 113 pounds. She has no complaints today.   GI Review of Systems      Denies abdominal pain, acid reflux, belching, bloating, chest pain, dysphagia with liquids, dysphagia with solids, heartburn, loss of appetite, nausea, vomiting, vomiting blood, weight loss, and  weight gain.        Denies anal fissure, black tarry stools, change in bowel habit, constipation, diarrhea, diverticulosis, fecal incontinence, heme positive stool, hemorrhoids, irritable bowel syndrome, jaundice, light color stool, liver problems, rectal bleeding, and  rectal pain.    Current Medications (verified): 1)  Vitamin D 1000 Unit  Tabs (Cholecalciferol) .... Take 1 Tablet By Mouth 1 Time Per Day 2)  Vitamin K .... Take 1 Tablet By Mouth Once A Day 3)  Magnesium Oxide 400 Mg  Caps (Magnesium Oxide) .... Take 1 Tablet By Mouth Once A Day 4)  Fish Oil   Oil (Fish Oil) .... Take 1 Tablet By Mouth Once A Day 5)  Glucosamine-Chondroitin 750-600 Mg Tabs (Glucosamine-Chondroitin) .... Take 1 Tablet By Mouth Once A Day 6)  Selenium 200 Mcg  Tabs (Selenium) .... Take 1  Tablet By Mouth Once A Day 7)  Vitamin E Blend 200 Unit  Caps (Vitamin E) .... Take 1 Tablet By Mouth Once A Day 8)  Deplin 7.5 Mg  Tabs (L-Methylfolate) .... Take 1 Tablet By Mouth Once A Day 9)  Amitiza 24 Mcg  Caps (Lubiprostone) .... Take 1 Tablet By Mouth Two Times A Day 10)  Miralax   Powd (Polyethylene Glycol 3350) .... Take 1 Scoop Dissolved in At Least 8 Ounces of Water Daily 11)  Biotin 1000 Mcg  Tabs (Biotin) .... Take 1 Tablet By Mouth Once A Day 12)  Qc Daily Multivitamins/iron  Tabs (Multiple Vitamins-Iron) .... One Tablet By Mouth Once Daily 13)  Wellbutrin Sr 150 Mg  Tb12 (Bupropion Hcl) .... 3 Tablets Once Daily 14)  Prozac 20 Mg  Caps (Fluoxetine Hcl) .... 3 Tablets Once Daily 15)  Anusol-Hc 25 Mg  Supp (Hydrocortisone Acetate) .... Insert 1 Suppository Into Rectum At Bedtime 16)  Phillips Milk of Magnesia 7.75 % Susp (Magnesium Hydroxide) .... Take 45 Cc By Mouth Once Daily 17)  Ra Antacid/anti-Gas 200-200-20 Mg/29ml Susp (Alum & Mag Hydroxide-Simeth) .... Use As Directed 18)  Probiotic  Caps (Probiotic Product) .... Take 1 Tablet By Mouth Once A Day 19)  Ranitidine Hcl 150 Mg Tabs (Ranitidine Hcl) .... Take 1 Tablet By Mouth Two Times A Day 20)  Boniva 150 Mg Tabs (Ibandronate Sodium) .... Take One Tablet By Mouth Once Monthly 21)  Trazodone Hcl 100 Mg Tabs (Trazodone Hcl) .... Take  2 Tablets By Mouth Daily 22)  Msm 500 Mg Caps (Methylsulfonylmethane) .... Take 1 Capsule By Mouth Once A Day 23)  B Complex  Tabs (B Complex Vitamins) .... Take 1 Tablet By Mouth Once A Day 24)  Calcium 500 Mg Tabs (Calcium) .... Take 1 Tablet By Mouth Once A Day 25)  Lactaid 3000 Unit Tabs (Lactase) .... Take 2 Tablets By Mouth Daily 26)  Mylanta 200-200-20 Mg/48ml Susp (Alum & Mag Hydroxide-Simeth) .... As Needed 27)  Vitamin C 500 Mg Tabs (Ascorbic Acid) .... One Tablet By Mouth Once Daily 28)  Vitamin E 400 Unit Caps (Vitamin E) .... One Capsule By Mouth Once Daily 29)  Gas-X Prevention  Caps  (Alpha-D-Galactosidase) .... As Needed  Allergies (verified): 1)  ! * Ambien Cr  Past History:  Past Medical History: HEMORRHOIDS, INTERNAL (ICD-455.0) RECTAL PAIN (ICD-569.42) OSTEOPOROSIS (ICD-733.00) RAYNAUD'S SYNDROME, HX OF (ICD-V12.59) DEPRESSION, HX OF (ICD-V11.8) GERD (ICD-530.81) CONSTIPATION (ICD-564.00) Hx of PSUEDO OBSTRUCTION (ICD-556.6) ANOREXIA, HX OF (ICD-V15.89) MEGACOLON (ICD-564.7)  Past Surgical History: Reviewed history from 05/05/2010 and no changes required. subtotal colectomy  and ileoproctostomy 1998 C-spine surgery  Family History: Reviewed history from 05/05/2010 and no changes required. Family History of Heart Disease: Paternal Grandfather Family History of Colitis/Crohn's:Aunt Family History of Liver Disease: Maternal Grandmother Family History of Pancreatic Cancer: MGF No FH of Colon Cancer:  Social History: Reviewed history from 05/05/2010 and no changes required. Occupation: [employer] Alcohol Use - yes Illicit Drug Use - no Patient gets regular exercise. Patient has never smoked.  Daily Caffeine Use coffee/tea  Review of Systems       The patient complains of back pain.  The patient denies allergy/sinus, anemia, anxiety-new, arthritis/joint pain, blood in urine, breast changes/lumps, change in vision, confusion, cough, coughing up blood, depression-new, fainting, fatigue, fever, headaches-new, hearing problems, heart murmur, heart rhythm changes, itching, menstrual pain, muscle pains/cramps, night sweats, nosebleeds, pregnancy symptoms, shortness of breath, skin rash, sleeping problems, sore throat, swelling of feet/legs, swollen lymph glands, thirst - excessive , urination - excessive , urination changes/pain, urine leakage, vision changes, and voice change.         Pertinent positive and negative review of systems were noted in the above HPI. All other ROS was otherwise negative.   Vital Signs:  Patient profile:    45 year old female Height:      69 inches Weight:      113 pounds BMI:     16.75 BSA:     1.62 Pulse rate:   72 / minute Pulse rhythm:   regular BP sitting:   116 / 64  (left arm) Cuff size:   regular  Vitals Entered By: Ok Anis CMA (November 18, 2010 3:59 PM)   Impression & Recommendations:  Problem # 1:  HEMORRHOIDS, INTERNAL (ICD-455.0) Patient currently has no symptoms.  Problem # 2:  CONSTIPATION (ICD-564.00) Patient is to continue the same laxative regimen which consists of fleets enemas and MiraLax as well as a high-fiber diet and Amitiza 24 ug two times a day.  Problem # 3:  ANOREXIA, HX OF (ICD-V15.89) Paient's weight is stable at 113 pounds. We have discussed extensively increasing her caloric intake and adding SlimFast to her diet. She needs to eat at least 1200 calories a day.She has not seen a counselor recently. 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  Patient Instructions: 1)  We have sent a prescription for Amitiza to [facility].  If you have not recieved your prescription within 1-2 weeks, please let us know so we can look into it. 2)  MiraLax 17 g daily. 3)  Fleets enema weekly. 4)  Increase caloric intake to 1200 calories a day by adding additional SlimFast. 5)  Copy sent to : Dr Catha Gosselin Prescriptions: AMITIZA 24 MCG  CAPS (LUBIPROSTONE) Take 1 tablet by mouth two times a day  #180 x 1   Entered by:   Lamona Curl CMA (AAMA)   Authorized by:   Hart Carwin MD   Signed by:   Lamona Curl CMA (AAMA) on 11/18/2010   Method used:   Electronically to        [facility]* (retail)             ,          Ph: [phone]       Fax: [phone]   RxID:   [id number]

## 2011-01-22 NOTE — Miscellaneous (Signed)
Summary: [facility]  Clinical Lists Changes  Allergies: Changed allergy or adverse reaction from * AMBIEN CR to * AMBIEN CR

## 2011-01-22 NOTE — Medication Information (Signed)
Summary: Letter from patient requesting Rx  Letter from patient requesting Rx   Imported By: Sherian Rein 01/16/2010 14:10:11  _____________________________________________________________________  External Attachment:    Type:   Image     Comment:   External Document  Appended Document: Letter from patient requesting Rx please make a statement  to approve use of pt's OTC meds. Andrea Wright, thanks for care You have provided to Andrea Wright  Appended Document: Letter from patient requesting Rx Scripts mailed to pt., message left for [name] to make her aware. Pt. instructed to call back as needed.

## 2011-01-22 NOTE — Progress Notes (Signed)
Summary: TRIAGE   Phone Note Call from Patient Call back at Work Phone [phone] Call back at CELL# [phone]   Caller: Patient Call For: Dr. Juanda Chance Reason for Call: Talk to Nurse Summary of Call: Pt.would like to discuss severe abd pain she experienced over the weekend. Initial call taken by: Vallarie Mare,  Apr 28, 2010 10:45 AM  Follow-up for Phone Call        Last OV was 05-11-2008.  Pt. had an episode of severe abd.pain on Thursday, woke her up during the night.  States, "I felt like I couldn't breath, my stomache was so swollen and it hurt. It didn't stop until Saturday. It was horrible!"  She took Pamprin, went on liquids, feels better today.  Also, she had a thrombosed hemorrhoid treated by Dr.Wyatt  at [facility] on 04-21-10.  1) See Dr.Marisol Glazer on 05-05-10 at 4pm 2) Soft,bland diet. No spicy,greasy,fried foods. Advance diet as tolerated. 3) Gas-x,Phazyme, etc. as needed for gas & bloating. 4) tylenol/Ibuprofen as needed 5) If symptoms become worse call back immediately or go to ER. 6) I will call pt., if new orders, after MD reviews.  Follow-up by: Laureen Ochs LPN,  Apr 29, 931 12:09 PM  Additional Follow-up for Phone Call Additional follow up Details #1::        reviewed and agree. Additional Follow-up by: Hart Carwin MD,  Apr 28, 2010 8:27 PM

## 2011-01-22 NOTE — Progress Notes (Signed)
   Phone Note Outgoing Call Call back at [facility] Phone [phone]   Call placed by: Jesse Fall, RN Call placed to: Patient Summary of Call: Discussed with Dr. Juanda Chance lab results from Renal Profile today. Per Dr. Juanda Chance- have patient stop laxatives. Use only tap water or fleet's enema. Send a copy of labs to Dr. Fredirick Maudlin office. Called and spoke with patient re: Dr. Regino Schultze recommendation. Called Dr. Fredirick Maudlin office and spoke with Noreene Larsson. Gave her Dr. Regino Schultze recommendation for patient and faxed a copy of labs to their office. Initial call taken by: Jesse Fall RN,  December 24, 2010 4:20 PM

## 2011-01-22 NOTE — Progress Notes (Signed)
Summary: procedure ?'s   Phone Note Call from Patient Call back at Work Phone [phone]   Caller: Patient Call For: Dr. Juanda Chance Reason for Call: Talk to Nurse Summary of Call: would like to discuss indetail the "test" that Dr. Juanda Chance told pt she needs... pt does not recall test name or reason Initial call taken by: Vallarie Mare,  September 25, 2010 9:22 AM  Follow-up for Phone Call        Message left for patient to call back. Jesse Fall RN  September 25, 2010 10:52 AM  patient recieved a recall notice for a flex.  All questions answered.  Flex scheduled for 10/07/10 11:00, pre-visit 09/30/10 2:00 Follow-up by: Darcey Nora RN, CGRN,  September 25, 2010 3:23 PM

## 2011-01-22 NOTE — Progress Notes (Signed)
Summary: speak to nurse   Phone Note Call from Patient Call back at Home Phone [phone]   Caller: Patient Call For: Dr Juanda Chance Reason for Call: Talk to Nurse Summary of Call: Patient wants to speak directly to nurse Initial call taken by: Tawni Levy,  December 26, 2010 8:23 AM  Follow-up for Phone Call        Patient calling to clarify what she can take for constipation. Explained to patient that Dr. Juanda Chance wants her to use tap water enemas or Fleets enemas. She will try this but states she is having hemorrhoid problems also. She is using suppositories that are helping with this. She also wants to know if she should stop the Amitiza. Please, advise.  Additional Follow-up for Phone Call Additional follow up Details #1::        It is not just the Amtiza. It is that she started using large anmount of the laxatives thinking that she was costipated. All of the above ( Mag Oxide, MOM, Miralax, Amitiza) caused her electrolytes to get out of control. She should stgick with her usual regimen , not deviate from it. She may use  enema to clear the lower colon if neccessary but don't increase any oral laxatives. Please check electrolytes q 2 weeks till stable. We can do it or her PCP can do iy. Additional Follow-up by: Hart Carwin MD,  December 27, 2010 7:28 PM    Additional Follow-up for Phone Call Additional follow up Details #2::    Patient given Dr. Juanda Chance recommendations. She states she understands. She states she will follow up with Dr. Clarene Duke to get labs rechecked. Patient seeing Dr. Rosalio Macadamia tomorrow and requested labs be faxed to her office. Labs faxed per patient request. Follow-up by: Jesse Fall RN,  December 29, 2010 10:59 AM

## 2011-01-22 NOTE — Letter (Signed)
Summary: Colonoscopy Date Change Letter  [facility]  [address] [place], [place] [address]   Phone: [phone]  Fax: [phone]      June 25, 2010 MRN: [id number]   [facility] [address] [place], [place]  [address]   Dear Ms. Demartini,   Previously you were recommended to have a repeat Flexible Sigmoidoscopy around this time. Your chart was recently reviewed by Dr. Hedwig Morton. Juanda Chance of [facility]. Follow up Flexible Sigmoidoscopy is now recommended in January 2016. This revised recommendation is based on current, nationally recognized guidelines for colorectal cancer screening and polyp surveillance. These guidelines are endorsed by the American Cancer Society, [employer] on Colorectal Cancer as well as numerous other major medical organizations.  Please understand that our recommendation assumes that you do not have any new symptoms such as bleeding, a change in bowel habits, anemia, or significant abdominal discomfort. If you do have any concerning GI symptoms or want to discuss the guideline recommendations, please call to arrange an office visit at your earliest convenience. Otherwise we will keep you in our reminder system and contact you 1-2 months prior to the date listed above to schedule your next colonoscopy.  Thank you,  Hedwig Morton. Juanda Chance, M.D.  [facility] [phone]  Appended Document: Colonoscopy Date Change Letter  ---- 07/29/2010 12:57 PM, Lamona Curl CMA Duncan Dull) wrote: Ms Lightsey came to see [place] on 05/05/10 of an office visit after having abdominal pain and bloating....Marland Kitchenat that visit, you recommended that she have a flex sig as it had been 6 years since her last one. She put the procedure off (I called to remind her a couple of times) so I flagged you and you asked me to put in a recall for August 2011. However, looks like the patient was sent a "flexible sigmoidoscopy  date change letter" saying that she is no longer due for her procedure until 2016....Marland Kitchenis she truly ok until 2016 or do I need to follow up with her?  ---- 07/29/2010 1:03 PM, Hart Carwin MD wrote: no need to follow up with her. thanks

## 2011-01-22 NOTE — Progress Notes (Signed)
Summary: Medication refill   Phone Note Call from Patient Call back at Home Phone [phone]   Caller: Patient Call For: Dr. Juanda Chance Reason for Call: Refill Medication Summary of Call: Refill on her Anusol and requesting phone call before it is called in Initial call taken by: Karna Christmas,  October 10, 2010 8:56 AM  Follow-up for Phone Call        Patient request refill on Anusol suppositories. I have sent that to patient's pharmacy. Patient also states that since her colonoscopy, she has been having some increased indigestion. She takes Zantac OTC but this has not helped this time. I have advised her to take prilosec OTC (up to 2 tablets=40 mg) in the morning and a Zantac at night for 1 week. She is to follow antireflux measures and stay on a bland diet. She will call us back if she does not notice any improvement in her symptoms. Follow-up by: Lamona Curl CMA Duncan Dull),  October 10, 2010 11:37 AM    Prescriptions: ANUSOL-HC 25 MG  SUPP (HYDROCORTISONE ACETATE) Insert 1 suppository into rectum at bedtime  #12 x 1   Entered by:   Lamona Curl CMA (AAMA)   Authorized by:   Hart Carwin MD   Signed by:   Lamona Curl CMA (AAMA) on 10/10/2010   Method used:   Electronically to        [facility]. [phone]* (retail)       [address]       [place], [place]  [address]       Ph: [phone]       Fax: [phone]   RxID:   [id number]

## 2011-01-22 NOTE — Miscellaneous (Signed)
Summary: Rx's  See note scanned into EMR. Hortense Ramal CMA Duncan Dull)  January 13, 2010 11:36 AM  Clinical Lists Changes  Medications: Added new medication of PHILLIPS MILK OF MAGNESIA 7.75 % SUSP (MAGNESIUM HYDROXIDE) Take 45 cc by mouth once daily - Signed Added new medication of FLEET MINERAL OIL  ENEM (MINERAL OIL) Use as directed - Signed Added new medication of RA ANTACID/ANTI-GAS 200-200-20 MG/5ML SUSP (ALUM & MAG HYDROXIDE-SIMETH) Use as directed - Signed Added new medication of MIRALAX  POWD (POLYETHYLENE GLYCOL 3350) Take as directed - Signed Added new medication of ALIGN  CAPS (PROBIOTIC PRODUCT) Take 1 tablet by mouth once a day - Signed Added new medication of RANITIDINE HCL 150 MG TABS (RANITIDINE HCL) Take 1 tablet by mouth once a day - Signed Rx of PHILLIPS MILK OF MAGNESIA 7.75 % SUSP (MAGNESIUM HYDROXIDE) Take 45 cc by mouth once daily;  #1 bottle x 5;  Signed;  Entered by: Hortense Ramal CMA (AAMA);  Authorized by: Hart Carwin MD;  Method used: Print then Mail to Patient Rx of FLEET MINERAL OIL  ENEM (MINERAL OIL) Use as directed;  #30 x 5;  Signed;  Entered by: Hortense Ramal CMA (AAMA);  Authorized by: Hart Carwin MD;  Method used: Print then Mail to Patient Rx of RA ANTACID/ANTI-GAS 200-200-20 MG/5ML SUSP (ALUM & MAG HYDROXIDE-SIMETH) Use as directed;  #1 bottle x 3;  Signed;  Entered by: Hortense Ramal CMA (AAMA);  Authorized by: Hart Carwin MD;  Method used: Print then Mail to Patient Rx of MIRALAX  POWD (POLYETHYLENE GLYCOL 3350) Take as directed;  #255 grams x 5;  Signed;  Entered by: Hortense Ramal CMA (AAMA);  Authorized by: Hart Carwin MD;  Method used: Print then Mail to Patient Rx of ALIGN  CAPS (PROBIOTIC PRODUCT) Take 1 tablet by mouth once a day;  #30 x 5;  Signed;  Entered by: Hortense Ramal CMA (AAMA);  Authorized by: Hart Carwin MD;  Method used: Print then Mail to Patient Rx of RANITIDINE HCL 150 MG TABS (RANITIDINE HCL) Take 1 tablet by mouth once a day;   #30 x 5;  Signed;  Entered by: Hortense Ramal CMA (AAMA);  Authorized by: Hart Carwin MD;  Method used: Print then Mail to Patient    Prescriptions: RANITIDINE HCL 150 MG TABS (RANITIDINE HCL) Take 1 tablet by mouth once a day  #30 x 5   Entered by:   Hortense Ramal CMA (AAMA)   Authorized by:   Hart Carwin MD   Signed by:   Hortense Ramal CMA (AAMA) on 01/13/2010   Method used:   Print then Mail to Patient   RxID:   [id number] ALIGN  CAPS (PROBIOTIC PRODUCT) Take 1 tablet by mouth once a day  #30 x 5   Entered by:   Hortense Ramal CMA (AAMA)   Authorized by:   Hart Carwin MD   Signed by:   Hortense Ramal CMA (AAMA) on 01/13/2010   Method used:   Print then Mail to Patient   RxID:   [id number] MIRALAX  POWD (POLYETHYLENE GLYCOL 3350) Take as directed  #255 grams x 5   Entered by:   Hortense Ramal CMA (AAMA)   Authorized by:   Hart Carwin MD   Signed by:   Hortense Ramal CMA (AAMA) on 01/13/2010   Method used:   Print then Mail to Patient   RxID:   [id number] RA ANTACID/ANTI-GAS 200-200-20 MG/5ML SUSP (  ALUM & MAG HYDROXIDE-SIMETH) Use as directed  #1 bottle x 3   Entered by:   Hortense Ramal CMA (AAMA)   Authorized by:   Hart Carwin MD   Signed by:   Hortense Ramal CMA (AAMA) on 01/13/2010   Method used:   Print then Mail to Patient   RxID:   [id number] FLEET MINERAL OIL  ENEM (MINERAL OIL) Use as directed  #30 x 5   Entered by:   Hortense Ramal CMA (AAMA)   Authorized by:   Hart Carwin MD   Signed by:   Hortense Ramal CMA (AAMA) on 01/13/2010   Method used:   Print then Mail to Patient   RxID:   [id number] PHILLIPS MILK OF MAGNESIA 7.75 % SUSP (MAGNESIUM HYDROXIDE) Take 45 cc by mouth once daily  #1 bottle x 5   Entered by:   Hortense Ramal CMA (AAMA)   Authorized by:   Hart Carwin MD   Signed by:   Hortense Ramal CMA (AAMA) on 01/13/2010   Method used:   Print then Mail to Patient   RxID:   [id number]

## 2011-01-28 ENCOUNTER — Telehealth: Payer: Self-pay | Admitting: Internal Medicine

## 2011-01-29 ENCOUNTER — Ambulatory Visit: Payer: Self-pay

## 2011-02-02 ENCOUNTER — Encounter (INDEPENDENT_AMBULATORY_CARE_PROVIDER_SITE_OTHER): Payer: Self-pay | Admitting: *Deleted

## 2011-02-02 ENCOUNTER — Other Ambulatory Visit: Payer: Self-pay | Admitting: Internal Medicine

## 2011-02-02 ENCOUNTER — Other Ambulatory Visit: Payer: BC Managed Care – PPO

## 2011-02-02 DIAGNOSIS — E878 Other disorders of electrolyte and fluid balance, not elsewhere classified: Secondary | ICD-10-CM

## 2011-02-02 LAB — ELECTROLYTE PANEL: Chloride: 88 mEq/L — ABNORMAL LOW (ref 96–112)

## 2011-02-05 NOTE — Progress Notes (Addendum)
Summary: Med refill   Phone Note Call from Patient Call back at Home Phone [phone]   Caller: Patient Call For: Dr. Juanda Chance Reason for Call: Talk to Nurse Summary of Call: Pt needs new RX for amitiza sent to [facility] pharmacy Initial call taken by: [place] Johnson,  January 28, 2011 2:38 PM  Follow-up for Phone Call        Patient was to have labs done with Dr Catha Gosselin as per Dr Regino Schultze request on 12/26/10 . She states that she did so. Unfortunately we never received those records so I have called to request them before we send refills of medications for her. Follow-up by: Lamona Curl CMA Duncan Dull),  January 29, 2011 8:34 AM  Additional Follow-up for Phone Call Additional follow up Details #1::        Dr Juanda Chance, patient stated that she had labs done with Dr Clarene Duke after our discussion with her on 12-26-10 that she needed labs. However, I have contacted Dr K.Little's office to get results and the last labs they have are from 12-19-10 (before we asked Patient to get repeat labs). I am going to have her come HERE for labs ASAP but do you want me to give her amitiza in the meantime or have her wait until she actually goes for lab draw? Additional Follow-up by: Lamona Curl CMA Duncan Dull),  January 29, 2011 11:58 AM    Additional Follow-up for Phone Call Additional follow up Details #2::    Yes, We need to review her renal profile before refilling her Amitiza. She is a high risk for electrolyte imbalance due to her eating disorder. Follow-up by: Hart Carwin MD,  January 29, 2011 9:48 PM  Additional Follow-up for Phone Call Additional follow up Details #3:: Details for Additional Follow-up Action Taken: I have spoken to patient to advise her that we have contacted Dr Fredirick Maudlin office and that they do not have any labs on file since 12/19/10. Therefore, she needs to have labs completed in our office before we give her refills of Amitiza. Patient verbalizes  understanding. Additional Follow-up by: Lamona Curl CMA Duncan Dull),  January 30, 2011 8:19 AM   Appended Document: Med refill ---- 02/03/2011 8:13 AM, Lamona Curl CMA (AAMA) wrote: Dr Juanda Chance, I see Ms.Coss had her electrolyte panel. I told her I would not give her any amitiza refills until she had labs. Do you want me to go ahead and give her refills of amitiza or hold off until her electrolytes stabilize?  ---- 02/03/2011 12:29 PM, Hart Carwin MD wrote: actually I asked Rene Kocher to reduce the Miralax to 1/2 dose and I would also hold her Amitiza x 4 weeks, repeat electrolytes then to see if she can go back on Amitiza.

## 2011-02-05 NOTE — Letter (Signed)
Summary: [facility]   Imported By: Lamona Curl CMA (AAMA) 01/29/2011 11:44:26  _____________________________________________________________________  External Attachment:    Type:   Image     Comment:   External Document  Appended Document: [facility] we have already reviewed and acted on these results.

## 2011-02-12 ENCOUNTER — Telehealth: Payer: Self-pay | Admitting: Internal Medicine

## 2011-02-17 NOTE — Progress Notes (Signed)
Summary: Triage   Phone Note Call from Patient Call back at Home Phone [phone]   Caller: Patient Call For: Dr. Juanda Chance Reason for Call: Lab or Test Results Summary of Call: York Spaniel she was returning your call possibly about her Lab results Initial call taken by: Karna Christmas,  February 12, 2011 8:53 AM  Follow-up for Phone Call        Called patient back and told her I had not called her since 02/02/11. She thought she had a message from me. Patient then asked if the Amitza had been refilled. Told patient no we will not be refilling. Per our notes she is to have labs rechecked in March before we decide on restarting Amitza. Follow-up by: Jesse Fall RN,  February 12, 2011 9:25 AM  Additional Follow-up for Phone Call Additional follow up Details #1::        I agree. Please set up labs for 2nd week in March, may be done by her PCP as long  as we get results. Additional Follow-up by: Hart Carwin MD,  February 12, 2011 10:07 AM    Additional Follow-up for Phone Call Additional follow up Details #2::    Patient is scheduled for labs on 03/03/11 at our office.  Follow-up by: Jesse Fall RN,  February 12, 2011 10:18 AM

## 2011-03-02 ENCOUNTER — Telehealth: Payer: Self-pay | Admitting: *Deleted

## 2011-03-03 ENCOUNTER — Encounter (INDEPENDENT_AMBULATORY_CARE_PROVIDER_SITE_OTHER): Payer: Self-pay | Admitting: *Deleted

## 2011-03-03 ENCOUNTER — Encounter: Payer: Self-pay | Admitting: Internal Medicine

## 2011-03-03 ENCOUNTER — Other Ambulatory Visit: Payer: Self-pay | Admitting: Internal Medicine

## 2011-03-03 ENCOUNTER — Other Ambulatory Visit: Payer: BC Managed Care – PPO

## 2011-03-03 DIAGNOSIS — Z9189 Other specified personal risk factors, not elsewhere classified: Secondary | ICD-10-CM

## 2011-03-03 DIAGNOSIS — R197 Diarrhea, unspecified: Secondary | ICD-10-CM

## 2011-03-03 LAB — RENAL FUNCTION PANEL
CO2: 32 mEq/L (ref 19–32)
Calcium: 9.5 mg/dL (ref 8.4–10.5)
Creatinine, Ser: 0.7 mg/dL (ref 0.4–1.2)
Sodium: 129 mEq/L — ABNORMAL LOW (ref 135–145)

## 2011-03-06 LAB — CONVERTED CEMR LAB: Sodium, Ur: 47 meq/L

## 2011-03-10 NOTE — Progress Notes (Signed)
   Phone Note Outgoing Call Call back at [facility] Phone [phone]   Call placed by: Jesse Fall, RN Call placed to: Patient Summary of Call: Left message reminding patient she is due for labs tomorrow. Initial call taken by: Jesse Fall RN,  March 02, 2011 8:47 AM

## 2011-03-24 LAB — CBC
HCT: 37.2 % (ref 36.0–46.0)
Hemoglobin: 12.8 g/dL (ref 12.0–15.0)
MCHC: 34.4 g/dL (ref 30.0–36.0)
MCV: 96.7 fL (ref 78.0–100.0)
RBC: 3.85 MIL/uL — ABNORMAL LOW (ref 3.87–5.11)

## 2011-03-24 LAB — HCG, SERUM, QUALITATIVE: Preg, Serum: NEGATIVE

## 2011-05-01 ENCOUNTER — Telehealth: Payer: Self-pay | Admitting: *Deleted

## 2011-05-01 NOTE — Telephone Encounter (Signed)
Called to remind patient she is due for labs on Monday. Patient states she will come

## 2011-05-01 NOTE — Telephone Encounter (Signed)
Message copied by Jesse Fall on Fri May 01, 2011  9:36 AM ------      Message from: Jesse Fall      Created: Wed Mar 04, 2011  8:46 AM       Patient due for renal profile next week, Call and remind her.

## 2011-05-04 ENCOUNTER — Other Ambulatory Visit: Payer: BC Managed Care – PPO

## 2011-05-04 ENCOUNTER — Other Ambulatory Visit: Payer: Self-pay | Admitting: Internal Medicine

## 2011-05-04 ENCOUNTER — Telehealth: Payer: Self-pay | Admitting: Internal Medicine

## 2011-05-04 DIAGNOSIS — Z9189 Other specified personal risk factors, not elsewhere classified: Secondary | ICD-10-CM

## 2011-05-04 DIAGNOSIS — K5900 Constipation, unspecified: Secondary | ICD-10-CM

## 2011-05-04 DIAGNOSIS — K219 Gastro-esophageal reflux disease without esophagitis: Secondary | ICD-10-CM

## 2011-05-04 NOTE — Telephone Encounter (Signed)
Patient is due for lab work this week, but she is calling to report she will not be able to come for Renal profile until June 4th.

## 2011-05-04 NOTE — Telephone Encounter (Signed)
OK to have labs- renal profile  On May 25, 2011

## 2011-05-05 NOTE — Assessment & Plan Note (Signed)
[facility]                         GASTROENTEROLOGY OFFICE NOTE   NAME:Andrea Wright, Andrea Wright                        MRN:          [id number]  DATE:07/04/2007                            DOB:          1966/06/11    Ms. Ekman is a 45 year old white female with pseudo obstruction.  She  has an eating disorder treated last year at [facility].  She has a history of  atonic colon causing pseudo obstruction, status post subtotal colectomy  in April 1998.  Last colonoscopy in April 2005 showed status post  subtotal colectomy with ileoproctostomy with a wide open anastomosis,  dilated rectum, and small bowel.  She has had severe constipation.  Two  weeks ago patient developed abdominal distention despite taking MiraLax  on daily basis, as well as Amitiza 24 mcg twice a day and Fleet's enemas  daily.  Her KUB from emergency room on June 28, 2007 confirmed status  post subtotal colectomy with dilated rectal sigmoid and large amount of  impacted stool.  She since then has taken magnesium citrate.  She was  quite distended yesterday but this morning had a bowel movement and  feels somewhat better.   PHYSICAL EXAMINATION:  Blood pressure 122/78, pulse 64, and weight 108  pounds.  Patient was cachectic, appeared depressed, pale.  LUNGS:  Clear to auscultation.  COR:  With normal S1, normal S2.  ABDOMEN:  Scaphoid, soft with palpable stool in left lower quadrant and  across around the umbilical area.  She had a well-healed abdominal  incision.  Bowel sounds were somewhat hyperactive.  RECTAL:  Essentially no stool in the rectal vault, small amounts were  Hemoccult negative.  Rectal tone was normal.   IMPRESSION:  1. Status post subtotal colectomy and ileoproctostomy for colonic      inertia in 1998, recurrent constipation due to rectal atony with      secondary sigmoid dilatation.  2. Eating disorder/anorexia.   PLAN:  1. Magnesium citrate 1 bottle daily x3.  2. Slow-K 2  p.o. daily.  3. Continue all other laxatives.   My plan would be for her to evacuate all the solid stool in her colon  despite having liquid stool seeking around the stool impaction.  We will  repeat the KUB in about a week to make sure that she has evacuated all  the solid stool.  She will let us know.    Hedwig Morton. Juanda Chance, MD  Electronically Signed   DMB/MedQ  DD: 07/04/2007  DT: 07/05/2007  Job #: [id number]   cc:   Caryn Bee L. Little, M.D.

## 2011-05-05 NOTE — Telephone Encounter (Signed)
Spoke with patient and r/s labs.

## 2011-05-05 NOTE — Telephone Encounter (Signed)
Addended by: Jesse Fall on: 05/05/2011 09:00 AM   Modules accepted: Orders

## 2011-05-25 ENCOUNTER — Other Ambulatory Visit (INDEPENDENT_AMBULATORY_CARE_PROVIDER_SITE_OTHER): Payer: BC Managed Care – PPO

## 2011-05-25 DIAGNOSIS — K5900 Constipation, unspecified: Secondary | ICD-10-CM

## 2011-05-25 LAB — RENAL FUNCTION PANEL
CO2: 31 mEq/L (ref 19–32)
Chloride: 92 mEq/L — ABNORMAL LOW (ref 96–112)
GFR: 91.06 mL/min (ref >60.00)
Sodium: 130 mEq/L — ABNORMAL LOW (ref 135–145)

## 2011-05-26 ENCOUNTER — Telehealth: Payer: Self-pay | Admitting: *Deleted

## 2011-05-26 DIAGNOSIS — K219 Gastro-esophageal reflux disease without esophagitis: Secondary | ICD-10-CM

## 2011-05-26 NOTE — Telephone Encounter (Signed)
Left message for patient to call me back. Orders in EPIC for repeat labs on 07/26/11. Note to remind patient.

## 2011-05-26 NOTE — Telephone Encounter (Signed)
Message copied by Daphine Deutscher on Tue May 26, 2011  1:50 PM ------      Message from: Hart Carwin      Created: Tue May 26, 2011  1:03 PM       Please call pt, electrolytes stable, Na+ still slightly low, repeat 2 months

## 2011-05-27 NOTE — Telephone Encounter (Signed)
Patient notified of results.

## 2011-07-24 ENCOUNTER — Telehealth: Payer: Self-pay | Admitting: *Deleted

## 2011-07-24 NOTE — Telephone Encounter (Signed)
Left a message for patient that labs are due on 07/26/11

## 2011-07-24 NOTE — Telephone Encounter (Signed)
Message copied by Daphine Deutscher on Fri Jul 24, 2011  9:07 AM ------      Message from: Daphine Deutscher      Created: Tue May 26, 2011  1:55 PM       Call and remind patient of repeat Renal funct lab on 07/26/11(DB)

## 2011-07-27 ENCOUNTER — Other Ambulatory Visit (INDEPENDENT_AMBULATORY_CARE_PROVIDER_SITE_OTHER): Payer: BC Managed Care – PPO

## 2011-07-27 DIAGNOSIS — K219 Gastro-esophageal reflux disease without esophagitis: Secondary | ICD-10-CM

## 2011-07-27 LAB — RENAL FUNCTION PANEL
CO2: 30 mEq/L (ref 19–32)
Chloride: 89 mEq/L — ABNORMAL LOW (ref 96–112)
GFR: 90.99 mL/min (ref >60.00)
Sodium: 127 mEq/L — ABNORMAL LOW (ref 135–145)

## 2011-07-28 ENCOUNTER — Telehealth: Payer: Self-pay | Admitting: *Deleted

## 2011-07-28 NOTE — Telephone Encounter (Signed)
Left a message for patient to call me. 

## 2011-07-28 NOTE — Telephone Encounter (Signed)
Labs faxed to Dr. Catha Gosselin patient's PCP

## 2011-07-28 NOTE — Telephone Encounter (Signed)
Message copied by Daphine Deutscher on Tue Jul 28, 2011  9:26 AM ------      Message from: Hart Carwin      Created: Mon Jul 27, 2011  9:24 PM       Please call pt with low Na+ and CL- due to laxative use.No change from previous results. Please send results to her PCP, she ought to follow up with him.

## 2011-07-28 NOTE — Telephone Encounter (Signed)
Patient given results and Dr. Brodie's recommendation 

## 2012-03-15 IMAGING — CR DG ABDOMEN 1V
1 series · 1 of 1 positions shown · non-contrast
Comparison: [facility], radiograph 04/14/2004

CLINICAL DATA: Constipation and megacolon.

ABDOMEN - 1 VIEW

[view not recorded]
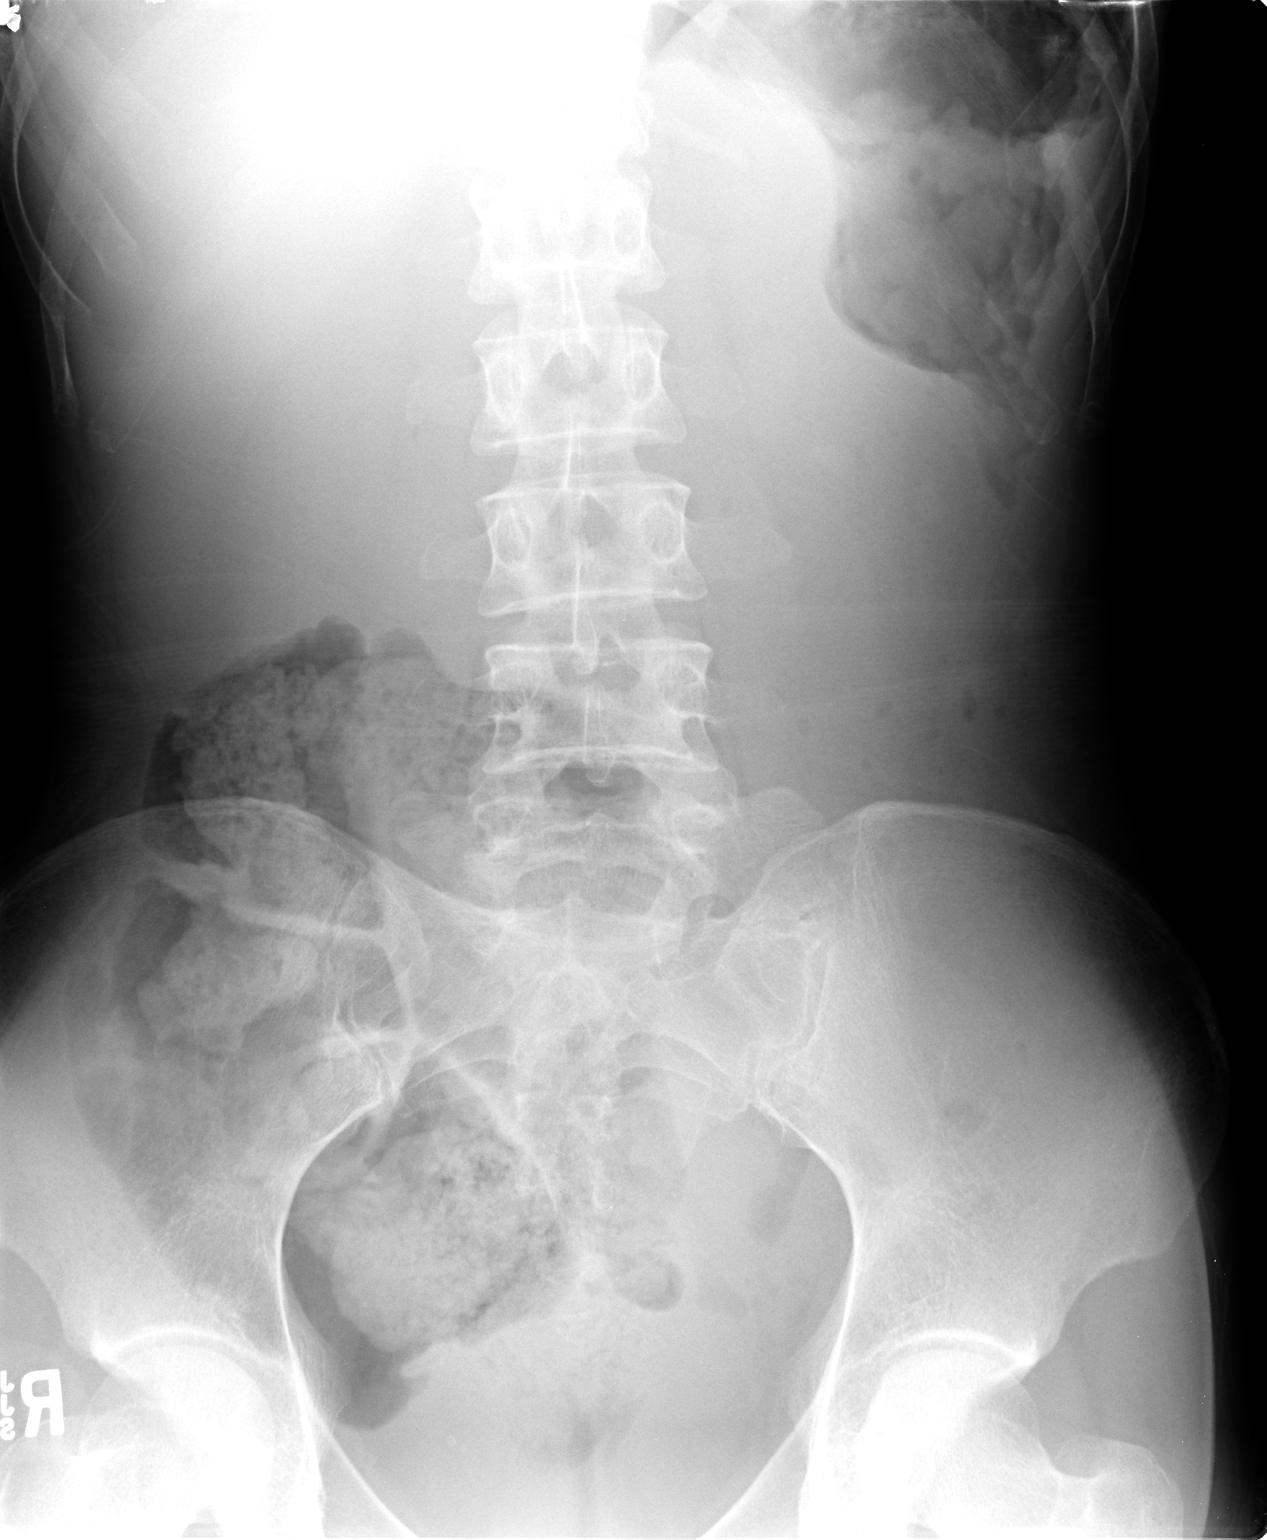

[1 of 1 positions shown; findings below may reference images not displayed]

FINDINGS: There is a prominent caliber of the colon which is
positioned in the right lower quadrant, and favored to be the
cecum.  This measures up to 9.1 cm.  There is stool within this
dilated colon.  A few locules of gas are identified in the
descending colon, which appears nearly completely completely
decompressed.  There is a paucity of bowel gas in the small bowel
loops.

Gas containing structure in the left upper quadrant is favored to
be the stomach.  No gas is seen within the distal portion of the
stomach.  No abnormal calcifications.

Transitional anatomy of the lumbosacral junction is noted, and
anatomic variant.
IMPRESSION: Prominent caliber of the colon in the right lower quadrant, which
is favored to be the cecum, measures up to 9.1 cm.

## 2014-12-06 ENCOUNTER — Telehealth: Payer: Self-pay | Admitting: Internal Medicine

## 2014-12-06 NOTE — Telephone Encounter (Signed)
Left a message for patient to call back. 

## 2014-12-13 NOTE — Telephone Encounter (Signed)
Left message for patient to call back if she still has any concerns

## 2014-12-17 ENCOUNTER — Other Ambulatory Visit: Payer: Self-pay | Admitting: Family Medicine

## 2014-12-17 DIAGNOSIS — R748 Abnormal levels of other serum enzymes: Secondary | ICD-10-CM

## 2014-12-17 DIAGNOSIS — R197 Diarrhea, unspecified: Secondary | ICD-10-CM

## 2014-12-17 NOTE — Telephone Encounter (Signed)
Left a message again.

## 2014-12-18 NOTE — Telephone Encounter (Signed)
Patient has not returned calls.

## 2015-05-12 ENCOUNTER — Encounter (HOSPITAL_COMMUNITY): Payer: Self-pay | Admitting: *Deleted

## 2015-05-12 ENCOUNTER — Emergency Department (HOSPITAL_COMMUNITY)
Admission: EM | Admit: 2015-05-12 | Discharge: 2015-05-12 | Disposition: A | Discharge status: Home / Self Care | Payer: BLUE CROSS/BLUE SHIELD | Attending: Emergency Medicine | Admitting: Emergency Medicine

## 2015-05-12 DIAGNOSIS — H578 Other specified disorders of eye and adnexa: Secondary | ICD-10-CM | POA: Diagnosis present

## 2015-05-12 DIAGNOSIS — H53149 Visual discomfort, unspecified: Secondary | ICD-10-CM | POA: Insufficient documentation

## 2015-05-12 DIAGNOSIS — H1013 Acute atopic conjunctivitis, bilateral: Secondary | ICD-10-CM

## 2015-05-12 MED ORDER — NAPHAZOLINE-PHENIRAMINE 0.025-0.3 % OP SOLN
2.0000 [drp] | Freq: Four times a day (QID) | OPHTHALMIC | Status: DC | PRN
  Administered 2015-05-12: 2 [drp] via OPHTHALMIC
  Filled 2015-05-12: qty 15

## 2015-05-12 MED ORDER — TETRACAINE HCL 0.5 % OP SOLN
2.0000 [drp] | Freq: Once | OPHTHALMIC | Status: AC
  Administered 2015-05-12: 2 [drp] via OPHTHALMIC
  Filled 2015-05-12: qty 2

## 2015-05-12 MED ORDER — FLUORESCEIN SODIUM 1 MG OP STRP
1.0000 | ORAL_STRIP | Freq: Once | OPHTHALMIC | Status: AC
  Administered 2015-05-12: 1 via OPHTHALMIC
  Filled 2015-05-12: qty 1

## 2015-05-12 NOTE — ED Provider Notes (Signed)
CSN: [id number]     Arrival date & time 05/12/15  1900 History  This chart was scribed for Andrea AnisShari Virdia Ziesmer, PA-C, working with [name], MD by Andrea Wright, ED Scribe. This patient was seen in room WTR7/WTR7 and the patient's care was started at 8:00 PM.    Chief Complaint  Patient presents with  . Foreign Body in Eye   HPI  HPI Comments: Andrea Wright is a 49 y.o. female who presents to the Emergency Department complaining of bilateral eye pain, which she describes as a gritty feeling, that started at 4pm this afternoon. Pt endorses associated tearing, photophobia and lid swelling. Pt states she is not able to get her contacts out of her eyes. Pt states she made it through most of the day today, but around 4pm this afternoon she began experiencing bilateral eye pain with tearing and could not get her contacts out of her eyes due to the pain. Pt states she uses reusable contacts and takes them out every night. Pt denies purulent discharge.    History reviewed. No pertinent past medical history. History reviewed. No pertinent past surgical history. No family history on file. History  Substance Use Topics  . Smoking status: Never Smoker   . Smokeless tobacco: Not on file  . Alcohol Use: No   OB History    No data available     Review of Systems  Constitutional: Negative for fever and chills.  HENT: Negative for facial swelling.   Eyes: Positive for photophobia, pain and discharge.      Allergies  Lunesta and Zolpidem tartrate  Home Medications   Prior to Admission medications   Not on File   BP 145/88 mmHg  Pulse 80  Temp(Src) 99.3 F (37.4 C) (Oral)  Resp 16  SpO2 100% Physical Exam  Constitutional: She is oriented to person, place, and time. She appears well-developed and well-nourished. No distress.  HENT:  Head: Normocephalic and atraumatic.  Eyes: Pupils are equal, round, and reactive to light.  Upper and lower eye lid swelling bilaterally without redness.  There is mild chemosis of both eyes. Contact lenses in place over cornea. Cornea clear without hyphema.   Neck: Neck supple.  Cardiovascular: Normal rate.   Pulmonary/Chest: Effort normal. No respiratory distress.  Musculoskeletal: Normal range of motion.  Neurological: She is alert and oriented to person, place, and time. Coordination normal.  Skin: Skin is warm and dry. She is not diaphoretic.  Psychiatric: She has a normal mood and affect. Her behavior is normal.  Nursing note and vitals reviewed.   ED Course  Procedures  DIAGNOSTIC STUDIES: COORDINATION OF CARE: 8:10 PM Discussed treatment plan with pt at bedside and pt agreed to plan.  Labs Review Labs Reviewed - No data to display  Imaging Review No results found.   EKG Interpretation None      MDM  Contact lenses removed by PA Browning by digital manipulation.   Final diagnoses:  None    1. Allergic conjunctivitis  Antihistamine drops provided for suspect allergic conjunctivitis. She ahs an appointment with ophthalmologist next week and has been instructed not to use contacts before being seen.   I personally performed the services described in this documentation, which was scribed in my presence. The recorded information has been reviewed and is accurate.     Andrea AnisShari Kimora Stankovic, PA-C 05/12/15 2113  [name], MD 05/14/15 [phone]

## 2015-05-12 NOTE — ED Notes (Signed)
Pt reports she is unable to get her contacts out of her bila eyes today.  Pt reports burning to eyes.

## 2015-05-12 NOTE — Discharge Instructions (Signed)
Allergic Conjunctivitis  The conjunctiva is a thin membrane that covers the visible white part of the eyeball and the underside of the eyelids. This membrane protects and lubricates the eye. The membrane has small blood vessels running through it that can normally be seen. When the conjunctiva becomes inflamed, the condition is called conjunctivitis. In response to the inflammation, the conjunctival blood vessels become swollen. The swelling results in redness in the normally white part of the eye.  The blood vessels of this membrane also react when a person has allergies and is then called allergic conjunctivitis. This condition usually lasts for as long as the allergy persists. Allergic conjunctivitis cannot be passed to another person (non-contagious). The likelihood of bacterial infection is great and the cause is not likely due to allergies if the inflamed eye has:  · A sticky discharge.  · Discharge or sticking together of the lids in the morning.  · Scaling or flaking of the eyelids where the eyelashes come out.  · Red swollen eyelids.  CAUSES   · Viruses.  · Irritants such as foreign bodies.  · Chemicals.  · General allergic reactions.  · Inflammation or serious diseases in the inside or the outside of the eye or the orbit (the boney cavity in which the eye sits) can cause a "red eye."  SYMPTOMS   · Eye redness.  · Tearing.  · Itchy eyes.  · Burning feeling in the eyes.  · Clear drainage from the eye.  · Allergic reaction due to pollens or ragweed sensitivity. Seasonal allergic conjunctivitis is frequent in the spring when pollens are in the air and in the fall.  DIAGNOSIS   This condition, in its many forms, is usually diagnosed based on the history and an ophthalmological exam. It usually involves both eyes. If your eyes react at the same time every year, allergies may be the cause. While most "red eyes" are due to allergy or an infection, the role of an eye (ophthalmological) exam is important. The exam  can rule out serious diseases of the eye or orbit.  TREATMENT   · Non-antibiotic eye drops, ointments, or medications by mouth may be prescribed if the ophthalmologist is sure the conjunctivitis is due to allergies alone.  · Over-the-counter drops and ointments for allergic symptoms should be used only after other causes of conjunctivitis have been ruled out, or as your caregiver suggests.  Medications by mouth are often prescribed if other allergy-related symptoms are present. If the ophthalmologist is sure that the conjunctivitis is due to allergies alone, treatment is normally limited to drops or ointments to reduce itching and burning.  HOME CARE INSTRUCTIONS   · Wash hands before and after applying drops or ointments, or touching the inflamed eye(s) or eyelids.  · Do not let the eye dropper tip or ointment tube touch the eyelid when putting medicine in your eye.  · Stop using your soft contact lenses and throw them away. Use a new pair of lenses when recovery is complete. You should run through sterilizing cycles at least three times before use after complete recovery if the old soft contact lenses are to be used. Hard contact lenses should be stopped. They need to be thoroughly sterilized before use after recovery.  · Itching and burning eyes due to allergies is often relieved by using a cool cloth applied to closed eye(s).  SEEK MEDICAL CARE IF:   · Your problems do not go away after two or three days of treatment.  ·   Your lids are sticky (especially in the morning when you wake up) or stick together.  · Discharge develops. Antibiotics may be needed either as drops, ointment, or by mouth.  · You have extreme light sensitivity.  · An oral temperature above 102° F (38.9° C) develops.  · Pain in or around the eye or any other visual symptom develops.  MAKE SURE YOU:   · Understand these instructions.  · Will watch your condition.  · Will get help right away if you are not doing well or get worse.  Document  Released: 02/27/2003 Document Revised: 02/29/2012 Document Reviewed: 01/23/2008  ExitCare® Patient Information ©2015 [employer]. This information is not intended to replace advice given to you by your health care provider. Make sure you discuss any questions you have with your health care provider.

## 2015-05-13 ENCOUNTER — Encounter: Payer: Self-pay | Admitting: *Deleted

## 2015-05-13 ENCOUNTER — Encounter: Payer: BLUE CROSS/BLUE SHIELD | Attending: Family Medicine | Admitting: *Deleted

## 2015-05-13 VITALS — Ht 69.0 in | Wt 131.2 lb

## 2015-05-13 DIAGNOSIS — F5000 Anorexia nervosa, unspecified: Secondary | ICD-10-CM | POA: Diagnosis present

## 2015-05-13 DIAGNOSIS — Z713 Dietary counseling and surveillance: Secondary | ICD-10-CM | POA: Diagnosis not present
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
## 2015-05-30 ENCOUNTER — Encounter: Payer: BLUE CROSS/BLUE SHIELD | Attending: Family Medicine | Admitting: *Deleted

## 2015-05-30 VITALS — Wt 125.6 lb

## 2015-05-30 DIAGNOSIS — F5000 Anorexia nervosa, unspecified: Secondary | ICD-10-CM | POA: Insufficient documentation

## 2015-05-30 DIAGNOSIS — Z713 Dietary counseling and surveillance: Secondary | ICD-10-CM | POA: Insufficient documentation

## 2015-05-30 DIAGNOSIS — M810 Age-related osteoporosis without current pathological fracture: Secondary | ICD-10-CM

## 2015-05-30 NOTE — Progress Notes (Signed)
Appointment start time: 1500 Appointment end time: 1545  Patient was seen on 05/30/15 for nutrition counseling pertaining to disordered eating  Primary care provider: Dr. Janeice Robinson at [facility] Therapist: [name] Any other medical team members: Dr Evelene Croon, psychiatry   Assessment: Has ECT treatment about every 2 weeks at [facility].  She enjoys those treatments and attributes them to getting her through depression.  Has seen Ed (every 2 weeks) and Dr. Evelene Croon (every 2 months). No medication changes were made at her last visit.    She thinks she has been eating a little better, but still not as well as she knows she needs to.  She thinks her weight is the same as last time.  She has lost 6 more pounds, total of 9 pounds lost since discharge from [facility] 3 weeks ago.  She has Started back at pilates at home and does some leg lifts and resistance work.  She was not medically cleared to do so.  She was cleared to walk 45 minutes/day and that was when she weighed 135 lb and was eating adequately.  She no longer eats adequately.      Administered EAT-26 on 05/13/15 Score significant >20 Patient score: 26  Growth Metrics: Ideal BMI: 21.4 BMI today: 18.5 % Ideal today:  87% Goal rate of weight gain:  0.5 lb/week   Medical Information:    Constipation, diarrhea: yes orthostatis sometimes Headaches if she doesn't drink 4 cups coffee Positive for cold intolerance, not as bad but still an issue   Mental health diagnosis: AN, restricting   Dietary assessment: A typical day consists of 1 meals and 2 snacks.   Discharge meal plan is for 3 meals and 2-3 snacks  Safe foods include: fruits and vegetables.  Is vegan (deos eat yogurt, milk, egg white, nuts, seeds are ok).  Was able to eat vegetarian in [facility] (ate cheese and eggs) gone back to vegan at home, plain baked potatoe Avoided foods include:fat, mac-n-cheese, mashed potatoes, meat, heavy desserts (doesn't eat legumes because they mess up her GI  track) - willing to try nutritional yeast  24 hour recall Green salad with some dressing and avocado S: apple D: slads with goat cheese and mixed vegetables  B: watermelon A: apple and 2 egg whites   What Methods Do You Use To Control Your Weight (Compensatory behaviors)?           Restricting (calories, fat, carbs): currently restricting   Exercise (what type): pilates and weight training outside of medical clearance    Estimated energy intake: 500-600 kcal  Estimated energy needs: 1800-2300 kcal 225-275 g CHO 90-110 g pro 60-73 g fat  Nutrition Diagnosis: NI-1.4 Inadequate energy intake As related to eating disorder and calorie restriction.  As evidenced by dietary recall .  Intervention/Goals: Discussed how weight-training exercises will further deplete muscles if adequate nutrition is not on board.  Discouraged pilates and weight exercises until she's eating better.  Encouraged her to focus on physical health and strength; remember how she felt right before discharge?  How strong she was and how much energy she had, how her body functioned properly.  Discucssed also energy needs of the brain and how a starved brain doesn't function properly.  She agreed to increase her intake slightly, focusing on carbohydrates and proteins.  She really doesn't eat protein as she's a vegetarian.  She doesn't eat beans as she's had bowel resection, she doesn't like tofu, is scared of eggs and cheese, and  hasn't tried the Morningstar products yet...provided education about eating eggs and suggested mashed beans as they would have less fiber  Discharge Meal plan from [facility]:    3 meals    2-3 snacks To provide 3700 kcal     5 Milk 3 Vegetable 7 Fruit 12 Starch 11 Protein 9 Fat 4 CS  She's nowhere near that and not prepared to go there now so compromise meal plan:  Breakfasts: oatmeal, whole egg, apple       Watermelon, grits, yogurt   Lunches: salad with nuts on it and egg and dressing,  goat cheese or feta.  With yogurt and pita bread  Vegetable sandwich on pita with nuts (or Moringstar product), yogurt    Snacks: fruit nuts and yogurt              Veggies with plain hummus (Sabra brand)  boursin cheese with pita bread  Dinner: steamed veggies with nuts, bread  Soup with bread  Couscous with veggies and refried beans or morningstar  Try for 3-4 cups of coffee, no more.  And lots of water  You're ok to walk 45 minutes, but no weight exercises  Until the meal plan is followed consistently  Monitoring and Evaluation: Patient will follow up in 1 weeks.

## 2015-05-30 NOTE — Patient Instructions (Signed)
Breakfasts: oatmeal, whole egg, apple       Watermelon, grits, yogurt   Lunches: salad with nuts on it and egg and dressing, goat cheese or feta.  With yogurt and pita bread  Vegetable sandwich on pita with nuts (or Moringstar product), yogurt    Snacks: fruit nuts and yogurt              Veggies with plain hummus (Sabra brand)  boursin cheese with pita bread  Dinner: steamed veggies with nuts, bread  Soup with bread  Couscous with veggies and refried beans or morningstar  Try for 3-4 cups of coffee, no more.  And lots of water  You're ok to walk 45 minutes, but no weight exercises  Until the meal plan is followed consistently

## 2015-06-07 ENCOUNTER — Encounter: Payer: Self-pay | Admitting: *Deleted

## 2015-06-07 ENCOUNTER — Encounter: Payer: BLUE CROSS/BLUE SHIELD | Admitting: *Deleted

## 2015-06-07 VITALS — Ht 69.0 in | Wt 118.3 lb

## 2015-06-07 DIAGNOSIS — F5000 Anorexia nervosa, unspecified: Secondary | ICD-10-CM | POA: Diagnosis not present

## 2015-06-07 NOTE — Progress Notes (Signed)
Appointment start time: 0800 Appointment end time: 0845  Patient was seen on 06/07/15 for nutrition counseling pertaining to disordered eating  Primary care provider: Dr. Janeice Robinson at [facility] Therapist: [name] Any other medical team members: Dr Evelene Croon, psychiatry   Assessment:  Had a good week. Continues to get the ECT treatment at [facility] and really finds those helpful.   Went out to lunch with a friend from [place] and had a great time. She got a salad.  Her friend got a hamburger.  She thinks her friend has more weight to gain than she does. Saw therapist on Tuesday and discussed need to have breakfast.  She states she isn't hungry and she knows she's not doing any better with eating breakfast.  She continues to eat inadequately and lose excessive weight.    Growth Metrics: Ideal BMI: 21.4 BMI today: 17.5 % Ideal today:  82% Goal rate of weight gain:  0.5 lb/week   Medical Information:    Constipation, diarrhea: yes orthostatis sometimes Headaches if she doesn't drink 4 cups coffee Positive for cold intolerance, not as bad but still an issue Poor energy   Mental health diagnosis: AN, restricting   Dietary assessment: A typical day consists of 1 meals and 2 snacks.   Discharge meal plan is for 3 meals and 2-3 snacks  Safe foods include: fruits and vegetables.  Is vegan (deos eat yogurt, milk, egg white, nuts, seeds are ok).  Was able to eat vegetarian in [facility] (ate cheese and eggs) gone back to vegan at home, plain baked potatoe Avoided foods include:fat, mac-n-cheese, mashed potatoes, meat, heavy desserts (doesn't eat legumes because they mess up her GI track) - willing to try nutritional yeast  Recall: Wednesday B: none L: salad with egg and nuts D: steamed veggies, pita bread, yogurt, apple   Thursday B: none L: none D: salad with sunflower seeds, eggs, rice cake yogurt, fruit, cabbage  Physical activity: same  What Methods Do You Use To Control Your Weight  (Compensatory behaviors)?           Restricting (calories, fat, carbs): currently restricting   Exercise (what type): pilates and weight training outside of medical clearance    Estimated energy intake: 500 kcal  Estimated energy needs: 1800-2300 kcal 225-275 g CHO 90-110 g pro 60-73 g fat  Nutrition Diagnosis: NI-1.4 Inadequate energy intake As related to eating disorder and calorie restriction.  As evidenced by dietary recall .  Intervention/Goals:   This provider discussed with patient's therapist earlier this week her rapidly declining weight.  Treatment team agreed that if patient weighs <120 lb, she would need to be recommended for rehospitalization.  Informed patient of this agreement and that her current weight justifies rehospitalization.  She does not want to be rehospitalized and has promised she will follow her meal plan.  This provider emphasized need for major calorie increase and that she needs to gain 1 pound/week going forward.  Discussed need for carbohydrates and adequate protein.  Discussed food is medicine and that inadequate nutrition is causing her major harm.  She is able to acknowledge she has very poor energy and isn't able to do things she used to do.  Advised protein shakes or other calorie beverages when she's not hungry, but still needs need nourishment.  Challenged diet myths.   Emphasized need for dairy 3 times, protein at least 3 times, and carbohydrates 5 times each day  Optimally she would follow Discharge Meal plan from [facility]:  3 meals    2-3 snacks To provide 3700 kcal     5 Milk 3 Vegetable 7 Fruit 12 Starch 11 Protein 9 Fat 4 CS  She's nowhere near that and not prepared to go there now so compromise meal plan: 3 meals and 3 snacks  Breakfasts: oatmeal, whole egg, apple       Watermelon, grits, yogurt   Lunches: salad with nuts on it and egg and dressing, goat cheese or feta.  With yogurt and pita bread  Vegetable sandwich on pita with nuts  (or Moringstar product), yogurt    Snacks: fruit nuts and yogurt              Veggies with plain hummus (Sabra brand)  boursin cheese with pita bread  Dinner: steamed veggies with nuts, bread  Soup with bread  Couscous with veggies and refried beans or morningstar  Try for 3-4 cups of coffee, no more.  And lots of water  *Full exercise restriction  Monitoring and Evaluation: Patient will follow up in 3 weeks. Patient needs more immediate follow up, but this provider is out of town for 1 week and then scheduling is not available any sooner

## 2015-06-25 ENCOUNTER — Encounter: Payer: BLUE CROSS/BLUE SHIELD | Attending: Family Medicine | Admitting: *Deleted

## 2015-06-25 ENCOUNTER — Encounter: Payer: Self-pay | Admitting: *Deleted

## 2015-06-25 VITALS — Wt 119.0 lb

## 2015-06-25 DIAGNOSIS — F5000 Anorexia nervosa, unspecified: Secondary | ICD-10-CM | POA: Insufficient documentation

## 2015-06-25 DIAGNOSIS — Z713 Dietary counseling and surveillance: Secondary | ICD-10-CM | POA: Diagnosis not present

## 2015-06-25 DIAGNOSIS — E441 Mild protein-calorie malnutrition: Secondary | ICD-10-CM

## 2015-06-25 NOTE — Patient Instructions (Signed)
Call parents for support before meals Watch a movie during meals to distract you  Breakfasts: oatmeal, whole egg, apple  Watermelon, grits, yogurt  Lunches: salad with nuts on it and egg and dressing, goat cheese or feta. With yogurt and pita bread Vegetable sandwich on pita with nuts (or Moringstar product), yogurt   Snacks: fruit nuts and yogurt  Veggies with plain hummus (Sabra brand) boursin cheese with pita bread  Dinner: steamed veggies with nuts, bread Soup with bread Couscous with veggies and refried beans or morningstar   Consider using stool for bowel movement Try protein shake in the mornings  Remember that you need fertile ground to grow your flowers.  You need good nutrition to help your depression, focus, energy, everything. You deserve 3 meals/day.  You don't have to do anything to earn that.  You deserve nourishment all day, every day

## 2015-06-25 NOTE — Progress Notes (Signed)
Appointment start time: 1600 Appointment end time: 1645  Patient was seen on 06/25/15 for nutrition counseling pertaining to disordered eating  [facility] Therapist: Lyanne CoEd Lurey Any other medical team members: Dr Evelene CroonKaur, psychiatry   Assessment:  Andrea Wright reports knowing she isn't eating adequately.  She was at the beach with her [place] for ~1 week and had a great time.  She ate really well with her family.  She ate more grains/staches and desserts; She ate 3 meals each day and did not struggle nearly as much with eating when she was with her parents.  Can't adhere to an adequate eating pattern at home; she eats less and gets upset/depressed at home.  Her therapist thinks it's lonliness and she needs more social interactions.  So she is working on the job search- is still contemplating being a Data processing managerdietitian, but she knows she can't work in her current state because she has no energy, no focus, and she's getting more and more depressed.  She knows it's because she doesn't eat enough  Is painfully constipated- coffee doesn't help.  She knows it's because she's not eating enough.    Growth Metrics: Ideal BMI: 21.4 BMI today: 17.7 % Ideal today:  82.5% Goal rate of weight gain:  0.5 lb/week   Medical Information:    Constipation, diarrhea: yes orthostatis sometimes Headaches if she doesn't drink 4 cups coffee Positive for cold intolerance, not as bad but still an issue Poor energy   Mental health diagnosis: AN, restricting type   Dietary assessment: A typical day consists of 1 meals and ~2 snacks.   [facility] 3 meals and 2-3 snacks  Safe foods include: fruits and vegetables.  Is vegan (does eat yogurt, milk, egg white, nuts, seeds are ok).  [facility] ([facility] and [facility]) gone back to vegan at home, plain baked potatoe Avoided foods include:fat, mac-n-cheese, mashed potatoes, meat, heavy desserts  (doesn't eat legumes because they mess up her GI track) - willing to try nutritional yeast   24 hour recall Steamed vegetables, 3 hard boiled eggs, watermelon, strawberries, rice cake, 1/2 piece biscotti  Physical activity: same  What Methods Do You Use To Control Your Weight (Compensatory behaviors)?           Restricting (calories, fat, carbs): currently restricting   Exercise (what type): pilates and weight training outside of medical clearance    Estimated energy intake: 500 kcal  Estimated energy needs: 1800-2300 kcal 225-275 g CHO 90-110 g pro 60-73 g fat  Nutrition Diagnosis: NI-1.4 Inadequate energy intake As related to eating disorder and calorie restriction.  As evidenced by dietary recall .  Intervention/Goals:   Discussed support with meals.  She suggested calling her family before each meal for encouragement.  This provider also suggested watching a movie while eating to serve as a distraction..  Corrected cognitive distortions and challenged ED beliefs.  Reinforced nourishment for mental health and for improved work possibilities.     Optimally she would follow [facility]:    3 meals    2-3 snacks To provide 3700 kcal     5 Milk 3 Vegetable 7 Fruit 12 Starch 11 Protein 9 Fat 4 CS  She's nowhere near that and not prepared to go there now so compromise meal plan: 3 meals and 3 snacks  Breakfasts: oatmeal, whole egg, apple  Watermelon, grits, yogurt   Lunches: salad with nuts on it and egg and dressing, goat cheese or feta.  With yogurt and pita bread  Vegetable sandwich on pita with nuts (or Moringstar product), yogurt    Snacks: fruit nuts and yogurt              Veggies with plain hummus (Sabra brand)  boursin cheese with pita bread  Dinner: steamed veggies with nuts, bread  Soup with bread  Couscous with veggies and refried beans or morningstar  Try for 3-4 cups of coffee, no more.  And lots of water  *Full exercise  restriction  Monitoring and Evaluation: Patient will follow up in 3 weeks. Patient needs more immediate follow up, but this provider is out of town for 1 week and then scheduling is not available any sooner

## 2015-07-05 ENCOUNTER — Encounter: Payer: BLUE CROSS/BLUE SHIELD | Admitting: *Deleted

## 2015-07-05 DIAGNOSIS — F5000 Anorexia nervosa, unspecified: Secondary | ICD-10-CM

## 2015-07-05 DIAGNOSIS — E441 Mild protein-calorie malnutrition: Secondary | ICD-10-CM

## 2015-07-05 NOTE — Patient Instructions (Addendum)
Add orange juice or citrus fruit when you eat your iron foods like spinach and beans Also try iron-fortified cereals Keep having spinach or limas with all your meals Try Morningstar soy products  You have 1 week to get back on track.   Need 3 meals and 2-3 snacks each day- ok to use protein shake as a replacement Eat cookies every day Need breakfast each day Need a carbohydrate each meal Need fat each meal Call your family for meal support each meal!!!!!!!!  Re-read "Life without Ed" Read "Intitive Eating" by Allean Foundesch and Tribole   Remember "Dear Ed, your lies don't fit in my meal plan."

## 2015-07-05 NOTE — Progress Notes (Signed)
Appointment start time: 1040 Appointment end time: 1100  Patient was seen on 07/05/15 for nutrition counseling pertaining to disordered eating  Primary care [facility] Therapist: Lyanne CoEd Lurey Any other medical team members: Dr Evelene CroonKaur, psychiatry   Assessment: patient was late for appointment.  Session was cut short.   She thinks she has lost weight.  Per conversation with therapist, treatment team agreed [facility] if her weight dropped below 119 lb.  She has dropped below that number.   Ate well when she was with her family.  Didn't ask server not to put butter on her food.  It was delicious.  She was full and she felt satisfied Her iron was too low to give blood so she's trying to increase her spinach and lima beans Has been eating dessert a couple nights, but she knows it's not enough.  She knows she still struggles eating at home.  She is also still exercising against recommendation.  She says it's for stress management.  Is pursing [employer] nutrition and possible volunteer work   Anthropometrics: Ideal BMI: 21.4 BMI today: 16.3 % Ideal today:  78.9% Goal rate of weight gain:  1 lb/week   Medical Information:    Constipation, diarrhea: yes orthostatis sometimes Headaches if she doesn't drink 4 cups coffee Positive for cold intolerance, not as bad but still an issue Poor energy   Mental health diagnosis: AN, restricting type   Dietary assessment: A typical day consists of 1 meals and ~2 snacks.   [facility] 3 meals and 2-3 snacks  Safe foods include: fruits and vegetables.  Is vegan (does eat yogurt, milk, egg white, nuts, seeds are ok).  [facility] (ate cheese and eggs) gone back to vegan at home, plain baked potatoe Avoided foods include:fat, mac-n-cheese, mashed potatoes, meat, heavy desserts (doesn't eat legumes because they mess up her GI track) - willing to try  nutritional yeast   24 hour recall Egg white and coffee Salad with nuts and eggs; lima beans Watermelon Apple Yogurt Rice cake Oatmeal raisin cookie 2 cups hot chocolate  Physical activity: same, walking 45 minutes daily  What Methods Do You Use To Control Your Weight (Compensatory behaviors)?           Restricting (calories, fat, carbs): currently restricting   Exercise (what type): pilates and weight training outside of medical clearance    Estimated energy intake: 500-700 kcal  Estimated energy needs: 2300-2800 kcal 225-275 g CHO 90-110 g pro 60-73 g fat  Nutrition Diagnosis: NI-1.4 Inadequate energy intake As related to eating disorder and calorie restriction.  As evidenced by dietary recall .  Intervention/Goals:   Vassie Moselleold [place] she needs to be readmitted.  She begged for 1 more week to give her another chance.  Asked how this next week would be any different?  She promises to eat 3 meals with starch and fat each meal.  Snack in between and dessert every day.    Goals: Add orange juice or citrus fruit when you eat your iron foods like spinach and beans Also try iron-fortified cereals Keep having spinach or limas with all your meals Try Morningstar soy products  You have 1 week to get back on track.   Need 3 meals and 2-3 snacks each day- ok to use protein shake as a replacement Eat cookies every day Need breakfast each day Need a carbohydrate  each meal Need fat each meal Call your family for meal support each meal!!!!!!!!  Monitoring and Evaluation: Patient will follow up in 1 weeks.

## 2015-07-11 ENCOUNTER — Encounter: Payer: BLUE CROSS/BLUE SHIELD | Admitting: *Deleted

## 2015-07-11 ENCOUNTER — Encounter: Payer: Self-pay | Admitting: *Deleted

## 2015-07-11 VITALS — Wt 114.8 lb

## 2015-07-11 DIAGNOSIS — F5000 Anorexia nervosa, unspecified: Secondary | ICD-10-CM | POA: Diagnosis not present

## 2015-07-11 NOTE — Patient Instructions (Signed)
Protein ideas: Cottage cheese Cheese Eggs peanut butter and nuts Veggie patties Regular milk  Starches: Bread Rice Quinoa Potato Crackers Regular pita  Limit size of your salads so you have more stomach room Please drink 1 protein shake each day!! Get frozen yogurt  Snacks: Fruit with nuts or yogurt Protein shake  Remember, what are you working towards!! You deserve it!!!  You need it! Even prisoners eat more than you do!!!

## 2015-07-11 NOTE — Progress Notes (Signed)
Appointment start time: 1030 Appointment end time: 1115  Patient was seen on 07/11/15 for nutrition counseling pertaining to disordered eating  Primary care provider: Dr. Janeice Wright at [facility] Therapist: Lyanne Wright Any other medical team members: Dr Andrea Wright, psychiatry   Assessment: Andrea Wright continues to lose weight.  She also continues to exercise, despite nutrition and therapist telling her not to.  She has increased her food intake a LITTLE bit, but not nearly enough to do anything.  She does not call her parents for meal support, she does not drink Ensure as promised, she does not eat breakfast, and she does not eat starches.  She has not complied with any nutrition recommendations.  Have told her already she needs to gain weight or she will need to be readmitted to [facility].    Made soup with tomato juice and lima beans.  Has been adding things to her dinner.  Bought bread from [employer] Has interview for volunteering at [facility] [employer]    Anthropometrics: Ideal BMI: 21.4 BMI today: 16.95 % Ideal today:  78.9% Goal rate of weight gain:  1 lb/week   Medical Information:    Constipation, diarrhea: yes orthostatis sometimes Headaches if she doesn't drink 4 cups coffee Positive for cold intolerance, not as bad but still an issue Poor energy   Mental health diagnosis: AN, restricting type   Dietary assessment: A typical day consists of 1 meals and ~2 snacks.   Discharge meal plan from [facility] is for 3 meals and 2-3 snacks  Safe foods include: fruits and vegetables.  Is vegan (does eat yogurt, milk, egg white, nuts, seeds are ok).  Was able to eat vegetarian in [facility] (ate cheese and eggs) gone back to vegan at home, plain baked potatoe Avoided foods include:fat, mac-n-cheese, mashed potatoes, meat, heavy desserts (doesn't eat legumes because they mess up her GI track) - willing to try nutritional yeast  24 hour recall B: milk L: large salad with egg,  sunflower seeds, 2 rice cakes S: apple D: cabbage, zucchini, squash, nuts, egg, collards, bread, yogurt, apple, watermelon, hot chocolate  Tuesday B: none L: lima bean and tomato soup D: large salad, watermelon, apple, yogurt, biscotti with hot chocolate made with water  Physical activity: 45 minutes walking daily   What Methods Do You Use To Control Your Weight (Compensatory behaviors)?           Restricting (calories, fat, carbs): currently restricting   Exercise (what type): pilates and weight training outside of medical clearance    Estimated energy intake: 700-800 kcal  Estimated energy needs: 2300-2800 kcal 225-275 g CHO 90-110 g pro 60-73 g fat  Nutrition Diagnosis: NI-1.4 Inadequate energy intake As related to eating disorder and calorie restriction.  As evidenced by dietary recall .  Intervention/Goals:   Told Andrea Wright again she needs to be readmitted.  If she does not gain weight or readmit herself, it will no longer be ethical to continue to work with her and she will be released from care.  Emphasized again how much she needs energy for her mental health, as well as her physical health.  How she deserves to eat and how very little she's actually eating.  Again asked her to refrain from exercise   Goals: Protein ideas: Cottage cheese Cheese Eggs peanut butter and nuts Veggie patties Regular milk  Starches: Bread Rice Quinoa Potato Crackers Regular pita  Limit size of your salads so you have more stomach room Please drink  1 protein shake each day!! Get frozen yogurt  Snacks: Fruit with nuts or yogurt Protein shake  Remember, what are you working towards!! You deserve it!!!  You need it! Even prisoners eat more than you do!!!   Monitoring and Evaluation: Patient will follow up in 1 weeks.

## 2015-07-12 ENCOUNTER — Encounter: Payer: Self-pay | Admitting: Internal Medicine

## 2015-07-17 ENCOUNTER — Ambulatory Visit: Payer: BLUE CROSS/BLUE SHIELD | Admitting: *Deleted

## 2015-07-23 ENCOUNTER — Ambulatory Visit: Payer: BLUE CROSS/BLUE SHIELD | Admitting: *Deleted

## 2015-07-25 ENCOUNTER — Encounter: Payer: BLUE CROSS/BLUE SHIELD | Attending: Family Medicine | Admitting: *Deleted

## 2015-07-25 VITALS — Wt 114.4 lb

## 2015-07-25 DIAGNOSIS — E441 Mild protein-calorie malnutrition: Secondary | ICD-10-CM

## 2015-07-25 DIAGNOSIS — F5000 Anorexia nervosa, unspecified: Secondary | ICD-10-CM | POA: Diagnosis present

## 2015-07-25 DIAGNOSIS — Z713 Dietary counseling and surveillance: Secondary | ICD-10-CM | POA: Diagnosis not present

## 2015-07-25 DIAGNOSIS — F509 Eating disorder, unspecified: Secondary | ICD-10-CM

## 2015-07-25 NOTE — Patient Instructions (Signed)
Breakfast: yogurt and 2 slices of bread with butter on them S: fruit with cheese or nuts L: salad with whole egg or veggie pattie or cheese and seeds or nuts S: Ensure D: salad or another vegetable with potato with butter on it and greek yogurt S: yasso or cookies  NO EXERCISE!!!!!!

## 2015-07-25 NOTE — Progress Notes (Signed)
Appointment start time: 1500 Appointment end time: 1645  Patient was seen on 07/25/15 for nutrition counseling pertaining to disordered eating  Primary care provider: Dr. Janeice Robinson at [facility] Therapist: [name] Any other medical team members: Dr Evelene Croon, psychiatry   Assessment: Andrea Wright has essentially maintained her weight since last visit at 114 lb.  That is a 20lb weight loss since her discharge from [facility].  She needs a higher level of care.  She continues to eat "better" when she's with her parents, but continues to struggle when she's on her own.  Even when she's with her parents, she still eats salads.  She continues to avoid protein-rich foods like soy patties.  She will eat some cheeses sometimes and some nuts, but not enough.  She also avoids starches.  She's inconsistent at best.  She knows she needs more, she knows she's not gaining, but she's just not able to break through her eating disorder thoughts.  She wants to buy a scale.  That is not a good idea.   Ed Lurey discouraged exercise on Tuesday.  She's tired after her walks and she knows she needs to stop exercising.  She PLANS to cut back  Is in process for volunteering for [facility]; has a contact at [employer]    Bought flat out wraps with salad on top- knows she needs to increase; bought some parmeasean bread  Bought flavored yogurts; knows she needs more      Anthropometrics: Ideal BMI: 21.4 BMI today: 16.95 % Ideal today:  78.9% Goal rate of weight gain:  1 lb/week   Medical Information:    Constipation, diarrhea: yes orthostatis sometimes Headaches if she doesn't drink 4 cups coffee Positive for cold intolerance, not as bad but still an issue Poor energy   Mental health diagnosis: AN, restricting type   Dietary assessment: A typical day consists of 1 meals and ~2 snacks.   Discharge meal plan from [facility] is for 3 meals and 2-3 snacks  Safe foods include: fruits and vegetables.  Is vegan (does  eat yogurt, milk, egg white, nuts, seeds are ok).  Was able to eat vegetarian in [facility] (ate cheese and eggs) gone back to vegan at home, plain baked potatoe Avoided foods include:fat, mac-n-cheese, mashed potatoes, meat, heavy desserts (doesn't eat legumes because they mess up her GI track) - willing to try nutritional yeast   L: salad with goat cheese, spinach, berries, seeds, 3 pieces of bread  Salad Tofu stir fry, stewed tomatoes with yellow squash, bread Mint chocolate chip ice cream   C[place]alope, scone, bread Apple Hard boiled egg white Vegetable sandwich Plans to have Ensure- has more of them  Physical activity: 3 miles instead of 4.5, plans not to walk at all tomorrow.      What Methods Do You Use To Control Your Weight (Compensatory behaviors)?           Restricting (calories, fat, carbs): currently restricting   Exercise (what type): pilates and weight training outside of medical clearance    Estimated energy intake: 700-800 kcal  Estimated energy needs: 2300-2800 kcal 225-275 g CHO 90-110 g pro 60-73 g fat  Nutrition Diagnosis: NI-1.4 Inadequate energy intake As related to eating disorder and calorie restriction.  As evidenced by dietary recall .  Intervention/Goals:   Emphasized again how much she needs energy for her mental health, as well as her physical health.  How she deserves to eat and how very little she's actually eating.  Again asked her to refrain from exercise   Goals: Protein ideas: Cottage cheese Cheese Eggs peanut butter and nuts Veggie patties Regular milk  Starches: Bread Rice Quinoa Potato Crackers Regular pita  Limit size of your salads so you have more stomach room Please drink 1 protein shake each day!! Get frozen yogurt  Snacks: Fruit with nuts or yogurt Protein shake  Remember, what are you working towards!! You deserve it!!!  You need it! Even prisoners eat more than you do!!!   Monitoring and Evaluation: Patient  will follow up in 2 weeks.

## 2015-07-26 ENCOUNTER — Encounter: Payer: Self-pay | Admitting: *Deleted

## 2015-08-09 ENCOUNTER — Encounter: Payer: BLUE CROSS/BLUE SHIELD | Admitting: *Deleted

## 2015-08-09 VITALS — Wt 114.8 lb

## 2015-08-09 DIAGNOSIS — F5000 Anorexia nervosa, unspecified: Secondary | ICD-10-CM

## 2015-08-09 DIAGNOSIS — E46 Unspecified protein-calorie malnutrition: Secondary | ICD-10-CM

## 2015-08-09 NOTE — Progress Notes (Signed)
Appointment start time: 1100  Appointment end time: 1130  Assessment:  Had portabella sandwich from [employer] Went to [employer] and got really good salad- ate all dressing  Finally got the morningstar burgers Has been drinking more supplements due to testing anxiety  Is thinking about joining another knitting group Is [place]  Is moving forward with graduate school.  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  24 hour recall:  B: 2 apple english muffin, 3 eggs, english muffin with light i cant believe it's nto butter, skim milk L: vegetable sandwich with Boost S: apple D: mornginstar patty, cooked vegetables, bread S: yasso popsicle, hot chocolate, Boost, fruit, yogurt  Nutrition Diagnosis:  Inadequate energy intake as related to eating disorder. As evidenced by dietary recall and weight loss.    Intervention:  Decrease fruit to 3/day, veg 3/day.  Increase starches to 3 times/day and protein 3 times/day.  Ensure 2 supplements/day and continue no exercise.  Still not gaining, need to gain or will need hospitalization.    Monitoring: 2 weeks

## 2015-08-19 ENCOUNTER — Encounter: Payer: Self-pay | Admitting: *Deleted

## 2015-08-19 ENCOUNTER — Encounter: Payer: BLUE CROSS/BLUE SHIELD | Admitting: *Deleted

## 2015-08-19 VITALS — Ht 69.0 in | Wt 116.4 lb

## 2015-08-19 DIAGNOSIS — E441 Mild protein-calorie malnutrition: Secondary | ICD-10-CM

## 2015-08-19 DIAGNOSIS — F5000 Anorexia nervosa, unspecified: Secondary | ICD-10-CM | POA: Diagnosis not present

## 2015-08-19 DIAGNOSIS — F509 Eating disorder, unspecified: Secondary | ICD-10-CM

## 2015-08-19 NOTE — Progress Notes (Signed)
Appointment start time: 1730  Appointment end time: 1800  Patient was seen on 08/19/15 for following nutrition counseling pertaining to anorexia nervosa  Assessment: she's gained >1 pound!! Thinks she ate well this past week.  Ordered flatbread from [facility] and ate it all!! And got dessert she enjoyed it and didn't feel guilty  Has decided not to go back to school Started volunteer training and that went well, is thinking about doing twice a week Ed wants to start seeing her weekly  Is meeting with her roommate from [facility] for lunch  States she's trying to get more bread and butter, eating more nuts.  drinking 2 shakes/day.  Today had a second apple because she was hungry  24 hour recall B: english muffin, 2 eggs, apple L: vegetable sandwich, nuts, apple 4 cups skim milk throughout the day 2 supplements D: salad, baked potato, multigrain roll S: hot chocolate  Nutrition diagnosis:  Inadequate calorie intake as related to eating disorder.  As evidenced by dietary recall   Intervention: talk with therapist about relaxation techniques.  Use resources given today and my previous case worker.  Ok to try yoga 2 days/week as long as it's relaxation, not intense or power yoga. Still need to increase calories: Real butter, whole egg, 2% milk, oil and vinagar dressing, more bread (take bread out of freezer).    Monitoring: patient will follow up in 2 weeks

## 2015-09-02 ENCOUNTER — Encounter: Payer: BLUE CROSS/BLUE SHIELD | Attending: Family Medicine | Admitting: *Deleted

## 2015-09-02 ENCOUNTER — Ambulatory Visit: Payer: BLUE CROSS/BLUE SHIELD | Admitting: *Deleted

## 2015-09-02 VITALS — Wt 116.4 lb

## 2015-09-02 DIAGNOSIS — F5000 Anorexia nervosa, unspecified: Secondary | ICD-10-CM | POA: Insufficient documentation

## 2015-09-02 DIAGNOSIS — Z713 Dietary counseling and surveillance: Secondary | ICD-10-CM | POA: Insufficient documentation

## 2015-09-02 DIAGNOSIS — E441 Mild protein-calorie malnutrition: Secondary | ICD-10-CM

## 2015-09-02 NOTE — Progress Notes (Signed)
Appointment start time: 1730  Appointment end time: 1800  Patient was seen on 08/19/11/16 for following nutrition counseling pertaining to anorexia nervosa  Assessment: Andrea Wright's weight has stabilized again.  She is not gaining.  She knows she's not eating enough.  She has not been able to increase her calories as discussed.  She is still very fearful of fat, carbohydrates, and is not able to consume vegetarian protein sources.  She knows her depression is getting worse.  She says she feels "lost"   24 hour recall B: english muffin with light butter, apple, egg white, coffee and skim milk L: vegetable sandwich on wrap (cucumber, lettuce, egg white, fat free dressing), apple, coffee milkshake (2 cup milk) D: salad, baked potato, roll, 2 supplements S: popsicle, hot chocolate  Estimated energy intake: 1600 kcal  Estimated energy needs: 1800-2300 kcal  Nutrition diagnosis:  Inadequate calorie intake as related to eating disorder.  As evidenced by dietary recall   Intervention: [name] has not been able to eat appropriately or make significant progress since discharge.  She needs a higher level of care to support her nutrition rehabilitation.  She can not exercise until she weighs 119 at least.  If she does not gain weight she will need to be hospitalized or it will no longer be ethical for her to continue in outpatient care.  She restored weight well at [facility] and she can do it again.  She knows about the dietary exchanges and she knows what she needs to do  Used analogy of food being as essential and as inoculous as oxygen.  We need it and we don't have to do anything to "earn it". Also suggested thinking of her niece, Damien Fusi, whom she loves and cares for.  How would she feed [name]?  How would she want [name] to be taken care of?  Reiterated she deserves to eat.  Reminded her on role of proper nutrition on mental health/depression  Monitoring: patient will follow up in 1 weeks

## 2015-09-03 ENCOUNTER — Encounter: Payer: Self-pay | Admitting: *Deleted

## 2015-09-09 ENCOUNTER — Encounter: Payer: Self-pay | Admitting: *Deleted

## 2015-09-09 ENCOUNTER — Encounter: Payer: BLUE CROSS/BLUE SHIELD | Admitting: *Deleted

## 2015-09-09 DIAGNOSIS — E440 Moderate protein-calorie malnutrition: Secondary | ICD-10-CM

## 2015-09-09 DIAGNOSIS — F5000 Anorexia nervosa, unspecified: Secondary | ICD-10-CM | POA: Diagnosis not present

## 2015-09-09 NOTE — Progress Notes (Signed)
Appointment start time: 1730  Appointment end time: 1815  Patient was seen on 09/09/15 for following nutrition counseling pertaining to anorexia nervosa  Assessment: Has been working on some relaxation exercises given by her case Production designer, theatre/television/film.  Likes those.  Has been volunteering at [facility] and is enjoying that.  Therapist suggested volunteering elsewhere. She thinks she might have gained weight this week.  She feels like she increased her calories, specifically starches.  Dietary recall reveals no change.  Scale reveals weight loss.  Andrea Wright has lost >20 pounds since discharge from [facility].  She is unable to follow a meal plan; she is unable to increase her calories in any way; her weight is very low.  Her current weight is 79% IBW, criteria for moderate malnutrition.  She has been told multiple times by this provider she needs a higher level of care and she has still been unable to progress with her treatment.  Her diet is deficient in all macronutrients: protein, carbohydrates, and fat.    24 hour recall B: english muffin, apple, 2 egg whites L: vegetable sandwich, apple, almonds D: roll, baked potato, salad, 2 supplements (~Ensure) S: yasso yogurt, rice cake  Estimated energy intake: 1400 kcal   Estimated energy needs: 2300 kcal  TANITA  BODY COMP RESULTS  09/09/15   BMI (kg/m^2) 17   Fat Mass (lbs) 16   Fat Free Mass (lbs) 99   Total Body Water (lbs) 72.5  %Body Fat 13.7     Nutrition diagnosis:  Inadequate calorie intake as related to eating disorder.  As evidenced by dietary recall   Intervention: Andrea Wright has not been able to eat appropriately or make significant progress since discharge.  She needs a higher level of care to support her nutrition rehabilitation.  She can not exercise until she weighs 119 at least.  If she does not gain weight she will need to be hospitalized or it will no longer be ethical for her to continue in outpatient care.  She restored weight well at [facility] and she can do it  again.  She knows about the dietary exchanges and she knows what she needs to do  [facility] may no longer take adults.  Suggested [employer].  Andrea Wright is to think about her options this week.  If she decides to pursue a higher level of care, she will continue to work with this provider until she's admitted. If she chooses not to pursue a higher level of care, she will be released from this provider's care next week.    Monitoring: patient will follow up in 1 week

## 2015-09-16 ENCOUNTER — Encounter: Payer: BLUE CROSS/BLUE SHIELD | Admitting: *Deleted

## 2015-09-16 VITALS — Wt 117.6 lb

## 2015-09-16 DIAGNOSIS — E441 Mild protein-calorie malnutrition: Secondary | ICD-10-CM

## 2015-09-16 DIAGNOSIS — F5000 Anorexia nervosa, unspecified: Secondary | ICD-10-CM | POA: Diagnosis not present

## 2015-09-16 NOTE — Progress Notes (Signed)
Appointment start time: 1045  Appointment end time: 1130  Patient was seen on 09/16/15 for following nutrition counseling pertaining to anorexia nervosa  Assessment: Eating whole eggs and drinking 4 supplements/day.  Has gained 3 pounds this week.  She refuses to work with a higher level of care.    24 hour recall B: 1 egg, roll, apple L: vegetable sandwich with egg S: nuts,apple D: salad with whole egg, yogurt, baked potato, roll S: nuts 4 Ensures total  Struggling with stomach bloat    Estimated energy intake: 2200 kcal   Estimated energy needs: 2300 kcal  TANITA  BODY COMP RESULTS  09/09/15   BMI (kg/m^2) 17   Fat Mass (lbs) 16   Fat Free Mass (lbs) 99   Total Body Water (lbs) 72.5  %Body Fat 13.7     Nutrition diagnosis:  Inadequate calorie intake as related to eating disorder.  As evidenced by dietary recall   Intervention: Explained "sponge analogy" as it pertains to malnourished GI tract.  Discussed self-care through challenging situations and how she deserves to eat.  If she is able to maintain 1lb + weight gain/week she can remain a patient in this practice.  If she is not able to maintain that weight gain, she will be dismissed as she refuses a higher level of care.  She agreed to add a 5th Ensure/day   Monitoring: patient will follow up in 1 week

## 2015-09-24 ENCOUNTER — Encounter: Payer: Self-pay | Admitting: *Deleted

## 2015-09-24 ENCOUNTER — Encounter: Payer: BLUE CROSS/BLUE SHIELD | Attending: Family Medicine | Admitting: *Deleted

## 2015-09-24 VITALS — Wt 118.2 lb

## 2015-09-24 DIAGNOSIS — Z713 Dietary counseling and surveillance: Secondary | ICD-10-CM | POA: Diagnosis not present

## 2015-09-24 DIAGNOSIS — E441 Mild protein-calorie malnutrition: Secondary | ICD-10-CM

## 2015-09-24 DIAGNOSIS — F5000 Anorexia nervosa, unspecified: Secondary | ICD-10-CM | POA: Diagnosis present

## 2015-09-24 NOTE — Progress Notes (Signed)
Appointment start time: 1500 Appointment end time: 1530  Patient was seen on 09/24/15 for following nutrition counseling pertaining to anorexia nervosa  Assessment: Has changed PCPs Saw GYN and confirmed bone scan results, low density in her back, but not in her hips, osteopenia, but not osteoporosis  Tried relaxation yoga class and meditation class.  Likes those and plans to take more in the future.   Had ETC treatment and saw her parents.  Is still doing her knitting group. 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  Is trying to keep her life full  Thinks her eating went ok this week.  Had to skip breakfast Friday morning for anesthesia for ETC treatment.  She has gained ~0.5 lb this week.  She's making progress, but it's slow.    24 hour recall B: egg, english muffin, apple L: vegetable sandwich: nuts, egg, cucumber, tomato, flatout wrap, salad dressing with large apple, nuts S: cafe latte D: steamed vegetables with dressing, nuts, eggs, roll, apple, watermelon, yogurt 4 supplements Hot chocolate, dark chocolate  Knows she needs that 5th supplement   Estimated energy intake: 2000-2200 kcal   Estimated energy needs: 2700 kcal  TANITA  BODY COMP RESULTS  09/09/15   BMI (kg/m^2) 17   Fat Mass (lbs) 16   Fat Free Mass (lbs) 99   Total Body Water (lbs) 72.5  %Body Fat 13.7     Nutrition diagnosis:  Inadequate calorie intake as related to eating disorder.  As evidenced by dietary recall   Intervention: praised progress.  Explained how improved nutrition improved cognitive functioning and mental health, thus enabling her to combat ED thoughts better; used Keys study as reference. Please add 5th supplement and add condiments: butter, honey, jelly, etc to boost nutrition without increase volume.  Consider trying TofuBaked If she is able to maintain 1lb + weight gain/week she can remain a patient in this practice.  If she is not able to maintain that weight gain, she will be dismissed as she refuses  a higher level of care.  She agreed to add a 5th Ensure/day   Monitoring: patient will follow up in 1 week

## 2015-10-01 ENCOUNTER — Encounter: Payer: Self-pay | Admitting: *Deleted

## 2015-10-01 ENCOUNTER — Encounter: Payer: BLUE CROSS/BLUE SHIELD | Admitting: *Deleted

## 2015-10-01 DIAGNOSIS — F5000 Anorexia nervosa, unspecified: Secondary | ICD-10-CM

## 2015-10-01 DIAGNOSIS — E440 Moderate protein-calorie malnutrition: Secondary | ICD-10-CM

## 2015-10-01 NOTE — Progress Notes (Signed)
Appointment start time: 1500 Appointment end time: 1530  Patient was seen on 10/01/15 for following nutrition counseling pertaining to anorexia nervosa  Assessment: had her physical last week and Fasting glucose 105 mg/dl. PCP did A1c, but those results are not in yet.  No family history of DM Alden appears very upset today and tried some in session.  She is upset about her yoga class, upset about a scarf she made, and upset about the weather.  She realizes her mental health is bad as she's more prone to depressive episodes.  Plans to call Dr. Evelene Croon, psychiatrist, to see about increasing her Prozac Has taken a couple yoga classes and she's not sure if she likes it Jolayne Panther). Thinks her eating has been "ok", maybe a little better. Has tried to increase butter and nuts; did not add the additional supplement.  Eats too much fruit.  Weight is sustained at 118  24 hour recall B: english muffin, 2 eggs, apple, coffee, milk Apple L: vegetable sandwich Large apple 4 supplements Nuts Milk and coffee Steamed vegetables, eggs, nuts, yogurt, roll Apple Hot chocolate, nuts watermelon   Estimated energy intake: 2000-2200 kcal   Estimated energy needs: 2700 kcal  TANITA  BODY COMP RESULTS  09/09/15   BMI (kg/m^2) 17   Fat Mass (lbs) 16   Fat Free Mass (lbs) 99   Total Body Water (lbs) 72.5  %Body Fat 13.7     Nutrition diagnosis:  Inadequate calorie intake as related to eating disorder.  As evidenced by dietary recall   Intervention: praised progress.  Explained how improved nutrition improved cognitive functioning and mental health, thus enabling her to combat ED thoughts better; used Keys study as reference. Please add 5th supplement and add condiments: butter, honey, jelly, etc to boost nutrition without increase volume.  Consider trying TofuBaked She's concerned about her increase stomach pouch.  Discussed how this is most likely due to her high level of fiber. Asked her to decrease  fruits and replace with starches, yogurt, hot chocolate, or 5th supplement.  Talked about challenge her ED voice and fighting back and reinforcing positive messages.     Monitoring: patient will follow up in 1 week

## 2015-10-08 ENCOUNTER — Encounter: Payer: BLUE CROSS/BLUE SHIELD | Admitting: *Deleted

## 2015-10-08 VITALS — Wt 120.2 lb

## 2015-10-08 DIAGNOSIS — F5000 Anorexia nervosa, unspecified: Secondary | ICD-10-CM | POA: Diagnosis not present

## 2015-10-08 DIAGNOSIS — E441 Mild protein-calorie malnutrition: Secondary | ICD-10-CM

## 2015-10-08 NOTE — Patient Instructions (Signed)
November 14 [employer] [facility] at 7:30

## 2015-10-08 NOTE — Progress Notes (Signed)
Appointment start time: 1500 Appointment end time: 1530  Patient was seen on 10/08/15 for following nutrition counseling pertaining to anorexia nervosa  Assessment:   Andrea Wright is in better spirits.  Called psychiatrist and had Prozac increased to 40 mg and feels a tremendous difference.  Has also increased to 5 supplements/day.  This was a struggle, but she was able to challenge her ED voice and talk herself through that struggle.  Is going to try [employer] [facility] [employer].   Going to the beach this weekend Gave blood last week and felt good about that- sense of purpose Is consdering volunteering at hospice.   24 hour recall B: whole egg, apple, english muffin, coffee, skim milk 5 supplements S: nuts periodically throughout  L: vegetable sandwith, apple, nuts,  steamed vegetables, nuts, eggs, yogurt, apple, hot chocolate   Estimated energy intake: 2200-2400 kcal   Estimated energy needs: 2700 kcal  TANITA  BODY COMP RESULTS  09/09/15   BMI (kg/m^2) 17   Fat Mass (lbs) 16   Fat Free Mass (lbs) 99   Total Body Water (lbs) 72.5  %Body Fat 13.7     Nutrition diagnosis:  Inadequate calorie intake as related to eating disorder.  As evidenced by dietary recall   Intervention: praised progress.  Explained how improved nutrition improved cognitive functioning and mental health, thus enabling her to combat ED thoughts better. Talked about challenge her ED voice and fighting back and reinforcing positive messages.   Ok to add 20 minutes walking 2 days/week.   Monitoring: patient will follow up in 1 week.  With continued progress, can decrease frequency of visits

## 2015-10-15 ENCOUNTER — Encounter: Payer: BLUE CROSS/BLUE SHIELD | Admitting: *Deleted

## 2015-10-15 VITALS — Wt 116.6 lb

## 2015-10-15 DIAGNOSIS — F5000 Anorexia nervosa, unspecified: Secondary | ICD-10-CM | POA: Diagnosis not present

## 2015-10-15 DIAGNOSIS — E441 Mild protein-calorie malnutrition: Secondary | ICD-10-CM

## 2015-10-15 DIAGNOSIS — F509 Eating disorder, unspecified: Secondary | ICD-10-CM

## 2015-10-15 NOTE — Progress Notes (Signed)
Appointment start time: 1600 Appointment end time: 1645  Patient was seen on 10/15/15 for following nutrition counseling pertaining to anorexia nervosa  Assessment:   [place] [facility] [place]  Had a great time.  Ate out on Friday evening and had dessert.  Felt a little guilty, but realized she wasn't that stuffed so felt better about it.  Ate out for lunch Saturday and dinner.  Ate cheese and nuts and things she hasn't had in awhile.  Ate some of her mom's dessert.  Walked more.  Walked when she got back and felt weak.  [place] the [place] this coming weekend  Will try radiance yoga when she gets back from the mountains next week Realizes she is compulsive about her walking. lost 4 pounds.  Did not drink supplements while away   24 hour recall B: whole egg, apple, english muffin, coffee, skim milk 5 supplements S: nuts periodically throughout  L: vegetable sandwith, apple, nuts,  steamed vegetables, nuts, eggs, yogurt, apple, hot chocolate   Estimated energy intake: 2200-2400 kcal   Estimated energy needs: 2700 kcal  TANITA  BODY COMP RESULTS  09/09/15   BMI (kg/m^2) 17   Fat Mass (lbs) 16   Fat Free Mass (lbs) 99   Total Body Water (lbs) 72.5  %Body Fat 13.7   Wt Readings from Last 3 Encounters:  10/15/15 116 lb 9.6 oz (52.889 kg)  10/08/15 120 lb 3.2 oz (54.522 kg)  09/24/15 118 lb 3.2 oz (53.615 kg)   Ht Readings from Last 3 Encounters:  08/19/15 5\' 9"  (1.753 m)  06/07/15 5\' 9"  (1.753 m)  05/13/15 5\' 9"  (1.753 m)   Body mass index is 17.21 kg/(m^2).   Nutrition diagnosis:  Inadequate calorie intake as related to eating disorder.  As evidenced by dietary recall   Intervention:  Agreed to no walking/exercise until weight is improved.  Agreed to bring supplements [place] the mountains this weekend to drink IN ADDITION to her eating.  Stressed need for improved intake.  Suggested peanut butter on english muffin, starch with dinner and protein  patty or cheese in salad.  Corrected cognitive distortions, challenged eating disorder thoughts.  emphasized need for adequate nutrition for her health and happiness.   Monitoring: patient will follow up in 1 week.

## 2015-10-16 ENCOUNTER — Encounter: Payer: Self-pay | Admitting: *Deleted

## 2015-10-24 ENCOUNTER — Encounter: Payer: BLUE CROSS/BLUE SHIELD | Attending: Family Medicine | Admitting: *Deleted

## 2015-10-24 VITALS — Wt 119.8 lb

## 2015-10-24 DIAGNOSIS — F5000 Anorexia nervosa, unspecified: Secondary | ICD-10-CM | POA: Diagnosis present

## 2015-10-24 DIAGNOSIS — Z713 Dietary counseling and surveillance: Secondary | ICD-10-CM | POA: Diagnosis not present

## 2015-10-24 DIAGNOSIS — E441 Mild protein-calorie malnutrition: Secondary | ICD-10-CM

## 2015-10-24 NOTE — Progress Notes (Signed)
Appointment start time: 1600 Appointment end time: 1630  Patient was seen on 10/24/15 for following nutrition counseling pertaining to anorexia nervosa  Assessment:  [place] with her [place]. Eating was a lot less stressful that she thought.Jamal Maes. Tried the pizza her sister bought Micah FlesherWent out to eat for lunch.  Consistently eats well out, but not at home Was very relaxed.  Had 3 supplements/day Tried dark chocolate and liked it    24 hour recall 5 supplements B: english muff, egg, apple L: nuts, vegeteable sandwich, apple D: steam veggies with eggs, yogurt, dinner roll S: nuts, hot chocolate, apple  [place].  No exercise at home   Estimated energy intake: 2200-2400 kcal   Estimated energy needs: 2700 kcal  TANITA  BODY COMP RESULTS  09/09/15   BMI (kg/m^2) 17   Fat Mass (lbs) 16   Fat Free Mass (lbs) 99   Total Body Water (lbs) 72.5  %Body Fat 13.7   Wt Readings from Last 3 Encounters:  10/24/15 119 lb 12.8 oz (54.341 kg)  10/15/15 116 lb 9.6 oz (52.889 kg)  10/08/15 120 lb 3.2 oz (54.522 kg)     Nutrition diagnosis:  Inadequate calorie intake as related to eating disorder.  As evidenced by dietary recall   Intervention:    Corrected cognitive distortions, challenged eating disorder thoughts.  emphasized need for adequate nutrition for her health and happiness. She agreed to try some cheese at home and try a protein bar  Suggested condiments: butter, cream cheese, jelly, peanut butter Suggested grape juice for resveratrol   Monitoring: patient will follow up in 1 week.

## 2015-10-31 ENCOUNTER — Encounter: Payer: BLUE CROSS/BLUE SHIELD | Admitting: *Deleted

## 2015-10-31 VITALS — Wt 118.0 lb

## 2015-10-31 DIAGNOSIS — E441 Mild protein-calorie malnutrition: Secondary | ICD-10-CM

## 2015-10-31 DIAGNOSIS — F5000 Anorexia nervosa, unspecified: Secondary | ICD-10-CM | POA: Diagnosis not present

## 2015-10-31 DIAGNOSIS — F509 Eating disorder, unspecified: Secondary | ICD-10-CM

## 2015-10-31 NOTE — Progress Notes (Signed)
Appointment start time: 1600 Appointment end time: 1630  Patient was seen on 10/31/15 for following nutrition counseling pertaining to anorexia nervosa  Assessment: Andrea Wright is in good spirits.  She added cheese daily to her meal plan and she's doing ok with that. She got a bar to try later today and is excited.  She thinks she gained weight:120 lb.  Per Ed's (therapist) scale.  118 on this provider's scale.  1 lb weight loss in 1 week.  She states she's pleased she gained.  (she hasn't gained...).  Her therapist has recommended kefir  Has been listening to music and that makes her feel good Her sister was at her house today and that was nice Thinks she's been reaching out to them a little more   24 hour recall B: english muffin, egg, apple L: vegetable sandwich with cheese, apple, skim milk, nuts D: steamed veggies, nuts, apple, yogurt 5 supplements Chocolate and hot chocolate  Yoga daily 1 hour each day   Estimated energy intake: 2200-2400 kcal   Estimated energy needs: 2700 kcal  TANITA  BODY COMP RESULTS  09/09/15   BMI (kg/m^2) 17   Fat Mass (lbs) 16   Fat Free Mass (lbs) 99   Total Body Water (lbs) 72.5  %Body Fat 13.7   Wt Readings from Last 3 Encounters:  11/01/15 118 lb (53.524 kg)  10/24/15 119 lb 12.8 oz (54.341 kg)  10/15/15 116 lb 9.6 oz (52.889 kg)    Nutrition diagnosis:  Inadequate calorie intake as related to eating disorder.  As evidenced by dietary recall   Intervention:    Reiterated need for positive messaging constantly!!.  "stay ahead of the pain".  Reiterated need for added nutrition via condiments.  She agreed to add butter to her english muffin each morning.  "butter is better"    Monitoring: patient will follow up in 1 week.

## 2015-11-01 ENCOUNTER — Encounter: Payer: Self-pay | Admitting: *Deleted

## 2015-11-12 ENCOUNTER — Encounter: Payer: Self-pay | Admitting: *Deleted

## 2015-11-12 ENCOUNTER — Encounter: Payer: BLUE CROSS/BLUE SHIELD | Admitting: *Deleted

## 2015-11-12 VITALS — Wt 121.5 lb

## 2015-11-12 DIAGNOSIS — E441 Mild protein-calorie malnutrition: Secondary | ICD-10-CM

## 2015-11-12 DIAGNOSIS — F5000 Anorexia nervosa, unspecified: Secondary | ICD-10-CM | POA: Diagnosis not present

## 2015-11-12 NOTE — Progress Notes (Signed)
Appointment start time: 1500 Appointment end time: 1530  Patient was seen on 11/12/15 for following nutrition counseling pertaining to anorexia nervosa  Assessment: Andrea Wright states she has been restricting in preparation for Thanksgiving.  Today she was able to challenge her ED thoughts and eat all her breakfast.  She thinks her stomach is poking out and asked her yoga instructor how to  Help her stomach get flatter.     24 hour recall B: apple and egg L: vegetable sandwich with cheese D: turnips, spinach, cabbage, steamed veggies with nuts and egg.  Roll, apple, yogurt, chocolate 5 supplements Hot chocolate  Yoga daily 1 hour each day  Today B: english muffin, egg, apple L: vegetable sandwich with cheese, apple 1.2 cups skim milk   Estimated energy intake: 2200-2400 kcal   Estimated energy needs: 2700 kcal  TANITA  BODY COMP RESULTS  09/09/15 11/12/15   BMI (kg/m^2) 17 17.9   Fat Mass (lbs) 16 23.5   Fat Free Mass (lbs) 99 98   Total Body Water (lbs) 72.5 71.5  %Body Fat 13.7 19.4    Nutrition diagnosis:  Inadequate calorie intake as related to eating disorder.  As evidenced by dietary recall   Intervention:    Strategized for Thanksgiving.  Gave 10 tips for being in recover over holidays.  emphasized need for nourishment up to and afterwards.  Challenged body dysmorphia.  Reiterated positive messages about food     Monitoring: patient will follow up in 2 weeks, per request.

## 2015-11-26 ENCOUNTER — Encounter: Payer: BLUE CROSS/BLUE SHIELD | Attending: Family Medicine | Admitting: *Deleted

## 2015-11-26 VITALS — Wt 120.5 lb

## 2015-11-26 DIAGNOSIS — E441 Mild protein-calorie malnutrition: Secondary | ICD-10-CM

## 2015-11-26 DIAGNOSIS — F5000 Anorexia nervosa, unspecified: Secondary | ICD-10-CM | POA: Diagnosis present

## 2015-11-26 DIAGNOSIS — Z713 Dietary counseling and surveillance: Secondary | ICD-10-CM | POA: Insufficient documentation

## 2015-11-26 NOTE — Progress Notes (Signed)
  Appointment start time: 1500 Appointment end time: 1530  Patient was seen on 11/26/15 for following nutrition counseling pertaining to anorexia nervosa  Assessment:  thanksgiving was good.  Ate different things, anxiety wasn't too bad.  Tried different foods.  Is continuing to gain slow weight.  Is struggling with body image (stomach for the most part). And stopped eating her english muffin because it takes too long to fix   24 hour recall: Apple, egg Vegetable sandwich with cheese, apple Turnips, spinach, cabbage, onions, roll, yogurt, apple 5 supplements    Estimated energy intake: 2200-2400 kcal   Estimated energy needs: 2500 kcal  TANITA  BODY COMP RESULTS  09/09/15 11/12/15   BMI (kg/m^2) 17 17.9   Fat Mass (lbs) 16 23.5   Fat Free Mass (lbs) 99 98   Total Body Water (lbs) 72.5 71.5  %Body Fat 13.7 19.4    Nutrition diagnosis:  Inadequate calorie intake as related to eating disorder.  As evidenced by dietary recall   Intervention:     Challenged body dysmorphia.  Reiterated positive messages about food; discussed photo-shopping and what a real woman's body should look like, especially an older woman's body who needs more fact to compensate for lack of estrogen.  Suggested getting "reality check" from trusted person about her body size. Need to continue to increase calories: add back in english muffin with a little butter.  [facility]   Monitoring: patient will follow up in 2 weeks, per request.

## 2015-12-10 ENCOUNTER — Encounter: Payer: Self-pay | Admitting: *Deleted

## 2015-12-10 ENCOUNTER — Encounter: Payer: BLUE CROSS/BLUE SHIELD | Admitting: *Deleted

## 2015-12-10 VITALS — Ht 69.0 in | Wt 120.0 lb

## 2015-12-10 DIAGNOSIS — F5000 Anorexia nervosa, unspecified: Secondary | ICD-10-CM

## 2015-12-10 DIAGNOSIS — E441 Mild protein-calorie malnutrition: Secondary | ICD-10-CM

## 2015-12-10 NOTE — Progress Notes (Signed)
Appointment start time: 1530  Appointment end time: 1600  Patient was seen on 12/10/15 for following nutrition counseling pertaining to anorexia nervosa  Assessment: [place].  Feels like she "ate well." risotto and various vegetables and slice caramel cake Got vegetable sandwich when she went out for lunch with wild rice salad That night had vegetable soufle and broccoli and bread Sunday had toast; subway for lunch and had veggie delight That night had cooked vegetables with dinner roll, apple, yogurt, and 5 supplements  Did not have supplements when she was in the mountains as she felt like she was eating more than enough.  Was tempted to restrict today in preparation for Christmas.  Feels dissatisfaction with her body and feels she is gaining excessive weight.  Did Tanita scan to reveal she is losing fat mass   Estimated energy intake: 2000-2200 kcal   Estimated energy needs: 2500+ kcal  TANITA  BODY COMP RESULTS  09/09/15 11/12/15 12/10/15   BMI (kg/m^2) 17 17.9 17.7   Fat Mass (lbs) 16 23.5 22   Fat Free Mass (lbs) 99 98 98   Total Body Water (lbs) 72.5 71.5 71.5  %Body Fat 13.7 19.4 18.4    Nutrition diagnosis:  Inadequate calorie intake as related to eating disorder.  As evidenced by dietary recall   Intervention:     Challenged body dysmorphia by using Tanita scan.  Advised she is losing weight slowly and she replied "that needs to stop."  Advised she needs the supplements daily until further notice.  Strategized for Christmas eating.  Reiterated positive messages about food and weight  Monitoring: patient will follow up in 1 weeks,.

## 2015-12-10 NOTE — Patient Instructions (Signed)
Food is just fuel.  It doesn't deserve to have power over you There are more important things like  Happiness  Peace of mind  Enjoying family and friends   Remind yourself all the time of this!!! Also your eating disorder is a Sales promotion account [employer]!!!! You haven't gained any fat, you actually have less fat than last month so you're belly is no bigger;  It's in your head So that means you need to continue to eat appropriately and gradually increase as you have not gained any weight this month  So on Saturday: breakfast and lunch as usual with your 5 supplements And on Sunday, breakfast as usual (game plan the lunch) and dinner with parents (pizza and salad) or dinner alone, as normal. With your supplements Supplements every day no matter what untilI Vernona RiegerLaura says otherwise   Vernona RiegerLaura is the only one allowed to make changes to how much you eat.  Not your eating disorder!!!!  The Ed in your head is a Sales promotion account [employer]

## 2015-12-17 ENCOUNTER — Encounter: Payer: BLUE CROSS/BLUE SHIELD | Admitting: *Deleted

## 2015-12-17 ENCOUNTER — Encounter: Payer: Self-pay | Admitting: *Deleted

## 2015-12-17 VITALS — Ht 69.0 in | Wt 122.5 lb

## 2015-12-17 DIAGNOSIS — E441 Mild protein-calorie malnutrition: Secondary | ICD-10-CM

## 2015-12-17 DIAGNOSIS — F5000 Anorexia nervosa, unspecified: Secondary | ICD-10-CM | POA: Diagnosis not present

## 2015-12-17 NOTE — Progress Notes (Signed)
Appointment start time: 1600  Appointment end time: 1630  Patient was seen on 12/17/15 for following nutrition counseling pertaining to anorexia nervosa  Assessment:  Andrea Wright had a good Christmas.  She was able to plan ahead with her meal and that helped her anxiety.  She did not restrict during the holiday weekend, but did restrict yesterday after the fact.  Feels she is gaining too much weight in her stomach  24 hour recall B: skipped L: sandwich with cucumber, eggs, dressing, cheese, apple in wrap D: apple, yogurt, 5 supplements, gumbo, cornbread Hot chocolate milk  Today had breakfast and lunch  Estimated energy intake: 2000 kcal   Estimated energy needs: 2500+ kcal  TANITA  BODY COMP RESULTS  09/09/15 11/12/15 12/10/15   BMI (kg/m^2) 17 17.9 17.7   Fat Mass (lbs) 16 23.5 22   Fat Free Mass (lbs) 99 98 98   Total Body Water (lbs) 72.5 71.5 71.5  %Body Fat 13.7 19.4 18.4    Nutrition diagnosis:  Inadequate calorie intake as related to eating disorder.  As evidenced by dietary recall   Intervention:     Nutrition counseling provided.  Discussed weight restoration goals, body fat distribution, body composition, and distorted body image.  Discussed using successes as fuel for future successes.   Discussed how much food it really takes to gain 1 pound and how adding a few hundred calories will not result in significant weight gain.  "Having fat doesn't make you fat." Suggested adding butter to breakfast or dinner or both.  [facility].  Challenged cognitive distortions  Monitoring: patient will follow up in 2 weeks, AMA .  This provider recommended weekly visits

## 2015-12-31 ENCOUNTER — Encounter: Payer: BLUE CROSS/BLUE SHIELD | Attending: Family Medicine | Admitting: *Deleted

## 2015-12-31 VITALS — Ht 69.0 in | Wt 121.0 lb

## 2015-12-31 DIAGNOSIS — Z713 Dietary counseling and surveillance: Secondary | ICD-10-CM | POA: Insufficient documentation

## 2015-12-31 DIAGNOSIS — F5000 Anorexia nervosa, unspecified: Secondary | ICD-10-CM | POA: Insufficient documentation

## 2015-12-31 DIAGNOSIS — E441 Mild protein-calorie malnutrition: Secondary | ICD-10-CM

## 2015-12-31 DIAGNOSIS — F509 Eating disorder, unspecified: Secondary | ICD-10-CM

## 2015-12-31 NOTE — Progress Notes (Signed)
Appointment start time: 1600  Appointment end time: 1645  Patient was seen on 12/31/15 for following nutrition counseling pertaining to anorexia nervosa  Assessment:  Micah FlesherWent out for her birthday and really enjoyed herself.  Got multiple vegetables and dessert. Has been sick with a cold and doesn't want to eat.  Skipped breakfast yesterday.  [facility].  Feels she has gained weight; terrified of being fat.  Struggles with extreme body dysmorphia   24 hour recall vegetables sandwich, apple, collards and stir fried vegetables, 2 slices bread, apple, yogurt, 5 supplements, hot chocolate  Estimated energy intake 1600-1800 kcal   Estimated energy needs: 2500+ kcal  TANITA  BODY COMP RESULTS  09/09/15 11/12/15 12/10/15   BMI (kg/m^2) 17 17.9 17.7   Fat Mass (lbs) 16 23.5 22   Fat Free Mass (lbs) 99 98 98   Total Body Water (lbs) 72.5 71.5 71.5  %Body Fat 13.7 19.4 18.4    Nutrition diagnosis:  Inadequate calorie intake as related to eating disorder.  As evidenced by dietary recall   Intervention:     Nutrition counseling provided.  Discussed weight restoration goals, body fat distribution, body composition, and distorted body image.  Challenged cognitive distortions.  Advised of slight weight loss.  Advised of increased energy needs while she's sick.  Discussed "set point"  Talking points: If Ed (in my head) didn't have control over my life I could: Read more Go out with friends See movies Take a class  Ed (in my head) has taken total control over my life.  I want my life back  You need more when you're sick.  Also your body has a set point and it will make hormonal adjustments to keep you there.  You can eat more and not gain excessive weight.    Eat that fig bar Add 1 slice bread with 1 tsp butter each day or peanut butter, PLUS all your other normal stuff even if you're not hungry.  Food is your medicine!!! Can't skip a dose or half the dose. Just like you take  your pills, this is your food prescription!!! You will be a bad patient if you don't follow this provider's orders :-)  Food is a guaranteed to make you better with no negative side efffects  Monitoring: patient will follow up in 2 weeks, AMA .  This provider recommended weekly visits

## 2015-12-31 NOTE — Patient Instructions (Addendum)
If Ed (in my head) didn't have control over my life I could: Read more Go out with friends See movies Take a class  Ed (in my head) has taken total control over my life.  I want my life back  You need more when you're sick.  Also your body has a set point and it will make hormonal adjustments to keep you there.  You can eat more and not gain excessive weight.    Eat that fig bar Add 1 slice bread with 1 tsp butter each day or peanut butter, PLUS all your other normal stuff even if you're not hungry.  Food is your medicine!!! Can't skip a dose or half the dose. Just like you take your pills, this is your food prescription!!! You will be a bad patient if you don't follow this provider's orders :-)  Food is a guaranteed to make you better with no negative side efffects

## 2016-01-14 ENCOUNTER — Encounter: Payer: BLUE CROSS/BLUE SHIELD | Admitting: *Deleted

## 2016-01-14 VITALS — Wt 122.5 lb

## 2016-01-14 DIAGNOSIS — E441 Mild protein-calorie malnutrition: Secondary | ICD-10-CM

## 2016-01-14 DIAGNOSIS — F5000 Anorexia nervosa, unspecified: Secondary | ICD-10-CM | POA: Diagnosis not present

## 2016-01-14 NOTE — Patient Instructions (Signed)
Ed in your head gives you alternative facts.  That is not acceptable If you're not progressing, you're regressing You could die.  FIGHT FOR YOUR LIFE!!!! Coming to Vernona Rieger and Ed is just attending class.  You still need to do your own studying and reading and work to pass and get your degree..  So you need to fight Ed with truth at home in order to survive  Goals are 3 meals each day plus supplements and hot chocolate and chocolate Each meal need a carb cause that's your fuel.  Your brain fuel!!!! You can't grow plants without soil!! Each meal need protein so don't lose any more muscle mass Each meal needs fat so your cells don't die

## 2016-01-14 NOTE — Progress Notes (Signed)
Appointment start time: 1600  Appointment end time: 1645  Patient was seen on 01/14/16 for following nutrition counseling pertaining to anorexia nervosa  Assessment: she ate the fig bar, but skipped breakfast.   24 hour recall B: skipped L: Vegetable sandwich, apple, coffee with milk D: steamed vegetables, egg, rye bread, yogurt, apple,  S:supplements, hot chocolate   Estimated energy intake 1600-1800 kcal   Estimated energy needs: 2500+ kcal  TANITA  BODY COMP RESULTS  09/09/15 11/12/15 12/10/15 01/14/16   BMI (kg/m^2) 17 17.9 17.7 18.1   Fat Mass (lbs) 16 23.5 22 23    Fat Free Mass (lbs) 99 98 98 99.5   Total Body Water (lbs) 72.5 71.5 71.5 73  %Body Fat 13.7 19.4 18.4 18.7    Nutrition diagnosis:  Inadequate calorie intake as related to eating disorder.  As evidenced by dietary recall   Intervention:     Nutrition counseling provided.  Discussed weight restoration goals, body fat distribution, body composition, and distorted body image.  Challenged cognitive distortions.   Ed in your head gives you alternative facts.  That is not acceptable If you're not progressing, you're regressing You could die.  FIGHT FOR YOUR LIFE!!!! Coming to [facility]ona Rieger and Ed is just attending class.  You still need to do your own studying and reading and work to pass and get your degree..  So you need to fight Ed with truth at home in order to survive  Goals are 3 meals each day plus supplements and hot chocolate and chocolate Each meal need a carb cause that's your fuel.  Your brain fuel!!!! You can't grow plants without soil!! Each meal need protein so don't lose any more muscle mass Each meal needs fat so your cells don't die  Monitoring: patient will follow up in 2 weeks, AMA .  This provider recommended weekly visits

## 2016-01-28 ENCOUNTER — Encounter: Payer: BLUE CROSS/BLUE SHIELD | Attending: Family Medicine | Admitting: *Deleted

## 2016-01-28 DIAGNOSIS — F5000 Anorexia nervosa, unspecified: Secondary | ICD-10-CM | POA: Diagnosis present

## 2016-01-28 DIAGNOSIS — Z713 Dietary counseling and surveillance: Secondary | ICD-10-CM | POA: Diagnosis not present

## 2016-01-28 DIAGNOSIS — E441 Mild protein-calorie malnutrition: Secondary | ICD-10-CM

## 2016-01-28 NOTE — Patient Instructions (Addendum)
[email or web address]   IOP Other options potentially are [employer] or [id number] or [employer] PHP programs Other options are Software [employer]  Ed gives you alternative facts Ed is not going to make [place] great again.  It's lies!  Don't wait until you're ready.  Just do it.  Fear is doing it anyway   Meal plan 3, not 2, not 1, but 3 meals each day  With starch, with protein, with fat each meal And 2 snacks each day with starch, with protein And your supplements until I say so No changes are allowed until Vernona Rieger says so.  Ed didn't go to RD school.  He doesn't know what he's talking about

## 2016-01-28 NOTE — Progress Notes (Signed)
Appointment start time: 1600  Appointment end time: 1700  Patient was seen on 01/28/16 for following nutrition counseling pertaining to anorexia nervosa  Assessment: she ate the fig bar, but skipped breakfast.  Krysta continues to struggle with following her meal plan.  This provider has advised a higher level of care   Estimated energy needs: 2500+ kcal  TANITA  BODY COMP RESULTS  09/09/15 11/12/15 12/10/15 01/14/16   BMI (kg/m^2) 17 17.9 17.7 18.1   Fat Mass (lbs) 16 23.5 22 23    Fat Free Mass (lbs) 99 98 98 99.5   Total Body Water (lbs) 72.5 71.5 71.5 73  %Body Fat 13.7 19.4 18.4 18.7    Nutrition diagnosis:  Inadequate calorie intake as related to eating disorder.  As evidenced by dietary recall   Intervention:     Nutrition counseling provided.  Again advised a change in treatment approach.  Suggested residential treatment, not inpatient, at various treatment facilities.  Also suggested PHP at several local options.  At the very least, suggested participating in body image group with Dr. Rosario Adie and gave informational flier.  Suggested also IOP via [facility] and gave information on all these services.  Stressed severity of eating disorders and Cyndee's need for recovery.  Attempted to empower and encourage her.  She did well at [facility]; she can do well again  Meal plan 3, not 2, not 1, but 3 meals each day  With starch, with protein, with fat each meal And 2 snacks each day with starch, with protein And your supplements until I say so   Monitoring: patient will follow up in 2 weeks, AMA .  This provider recommended weekly visits

## 2016-02-11 ENCOUNTER — Encounter: Payer: BLUE CROSS/BLUE SHIELD | Admitting: *Deleted

## 2016-02-11 DIAGNOSIS — F5000 Anorexia nervosa, unspecified: Secondary | ICD-10-CM | POA: Diagnosis not present

## 2016-02-11 DIAGNOSIS — F509 Eating disorder, unspecified: Secondary | ICD-10-CM

## 2016-02-11 NOTE — Patient Instructions (Addendum)
Call [facility]   [phone] Eat that bar!!! Do whatever opposite of Ed says :-)  Goal 3 meals with starch, protein, and fat each time!!!!!!!!!! Put on your glasses!  [employer] [address] [employer] (portabello sandwich or portabella pasta [employer] (seafood with vegetable sides or vegetable pasta)

## 2016-02-11 NOTE — Progress Notes (Signed)
Appointment start time: 1400  Appointment end time: 1445  Patient was seen on 02/11/16 for following nutrition counseling pertaining to anorexia nervosa  Assessment:  Started eating breakfast yesterday and today.  Had an apple.  Is really struggling mentally.  States she is unhappy with current therapist and would like a referral to another provider    Estimated energy needs: 2500+ kcal  TANITA  BODY COMP RESULTS  09/09/15 11/12/15 12/10/15 01/14/16   BMI (kg/m^2) 17 17.9 17.7 18.1   Fat Mass (lbs) 16 23.5 22 23    Fat Free Mass (lbs) 99 98 98 99.5   Total Body Water (lbs) 72.5 71.5 71.5 73  %Body Fat 13.7 19.4 18.4 18.7    Nutrition diagnosis:  Inadequate calorie intake as related to eating disorder.  As evidenced by dietary recall   Intervention:     Nutrition counseling provided. Gave contact info for [employer]  [place] is excited to have new therapist.  Reiterated messages about nutrition and self care.   Meal plan 3, not 2, not 1, but 3 meals each day  With starch, with protein, with fat each meal And 2 snacks each day with starch, with protein And your supplements until I say so   Monitoring: patient will follow up in 2 weeks, AMA .  This provider recommended weekly visits

## 2016-02-25 ENCOUNTER — Encounter: Payer: BLUE CROSS/BLUE SHIELD | Attending: Family Medicine | Admitting: *Deleted

## 2016-02-25 DIAGNOSIS — F5000 Anorexia nervosa, unspecified: Secondary | ICD-10-CM | POA: Diagnosis present

## 2016-02-25 DIAGNOSIS — Z713 Dietary counseling and surveillance: Secondary | ICD-10-CM | POA: Diagnosis not present

## 2016-02-25 DIAGNOSIS — E440 Moderate protein-calorie malnutrition: Secondary | ICD-10-CM

## 2016-02-25 NOTE — Progress Notes (Signed)
Appointment start time: 1400  Appointment end time: 1500  Patient was seen on 02/25/16 for following nutrition counseling pertaining to anorexia nervosa  Assessment: Andrea Wright has transitioned to Andrea Wright for therapy.  Has seen her once and will be seeing her every 2 weeks. Andrea Wright is really struggling with motivation to take care of herself and recover   24 hour recall B: apple L: veggie sandwich, steamed veggies with egg Bread, yogurt, apple, hot chocolate, peppermint patty, 5 supplements   Estimated energy needs: 2500+ kcal  TANITA  BODY COMP RESULTS  09/09/15 11/12/15 12/10/15 01/14/16 02/25/16   BMI (kg/m^2) 17 17.9 17.7 18.1 18.5   Fat Mass (lbs) 16 23.5 22 23 25    Fat Free Mass (lbs) 99 98 98 99.5 100   Total Body Water (lbs) 72.5 71.5 71.5 73 73  %Body Fat 13.7 19.4 18.4 18.7 20    Nutrition diagnosis:  Inadequate calorie intake as related to eating disorder.  As evidenced by dietary recall   Intervention:     Nutrition counseling provided. Spent the bulk of the meeting discussing need for adequate care.  Provided motivational interviewing  I deserve to live.  I deserve to eat and I deserve to recover.  Because I matter Ed is trying to kill me and that makes me mad.  I'm not going to listen anymore I'm going to follow this meal plan because that is what I deserve  B: apple, english muffin with peanut butter and 8 oz water L: veggie sandwich with cheese and yogurt and nuts and 8 oz water S: nuts and piece of fruit and hot chocolate D: 2 eggs, 1 cup veggies with butter and parmesan cheese, 1 roll with butter and 8 oz water and piece of chocolate S: 5 supplments   Monitoring: patient will follow up in 2 weeks, AMA .  This provider recommended weekly visits

## 2016-02-25 NOTE — Patient Instructions (Addendum)
I deserve to live.  I deserve to eat and I deserve to recover.  Because I matter Ed is trying to kill me and that makes me mad.  I'm not going to listen anymore I'm going to follow this meal plan because that is what I deserve  B: apple, english muffin with peanut butter and 8 oz water L: veggie sandwich with cheese and yogurt and nuts and 8 oz water S: nuts and piece of fruit and hot chocolate D: 2 eggs, 1 cup veggies with butter and parmesan cheese, 1 roll with butter and 8 oz water and piece of chocolate S: 5 supplments

## 2016-03-10 ENCOUNTER — Encounter: Payer: BLUE CROSS/BLUE SHIELD | Admitting: *Deleted

## 2016-03-10 VITALS — Wt 125.0 lb

## 2016-03-10 DIAGNOSIS — F5000 Anorexia nervosa, unspecified: Secondary | ICD-10-CM | POA: Diagnosis not present

## 2016-03-10 DIAGNOSIS — F509 Eating disorder, unspecified: Secondary | ICD-10-CM

## 2016-03-10 DIAGNOSIS — E441 Mild protein-calorie malnutrition: Secondary | ICD-10-CM

## 2016-03-10 NOTE — Progress Notes (Signed)
Appointment start time: 1400  Appointment end time: 1500  Patient was seen on 03/10/16 for following nutrition counseling pertaining to anorexia nervosa  Assessment:  [place] [employer] and really [place] her trip.  Ate out some and really liked those foods.   Feels better; Prozac increased and got some fresh perspective. Will be seeing [name] every other week.   Wants to watch Em[employer]. Thinks her eating is the same.  Wonders why she is struggling so much?  BM every 3-4 days No dizziness No headaches Still postiive for cold intolerance Energy is ok   24 hour recall Apple Vegetable sandwich with cheese, egg, cucumber,lettuce Cooked turnips, spinach, cabbage, onion, apple, yogurt, bread 5 spplements Hot chocolate  1 peanut M&M  Estimated energy intake 1800  Estimated energy needs: 2500+ kcal  TANITA  BODY COMP RESULTS  09/09/15 11/12/15 12/10/15 01/14/16 02/25/16   BMI (kg/m^2) 17 17.9 17.7 18.1 18.5   Fat Mass (lbs) 16 23.5 22 23 25    Fat Free Mass (lbs) 99 98 98 99.5 100   Total Body Water (lbs) 72.5 71.5 71.5 73 73  %Body Fat 13.7 19.4 18.4 18.7 20    Nutrition diagnosis:  Inadequate calorie intake as related to eating disorder.  As evidenced by dietary recall   Intervention:     Nutrition counseling provided. Spent the bulk of the meeting discussing need for adequate care.  Provided motivational interviewing and challenged cognitive distortions Stressed need for carbohyrdates for brain function so that she can think more clearly Created pro/con list of recovery    Monitoring: patient will follow up in 3 weeks, AMA .  This provider recommended weekly visits

## 2016-03-12 ENCOUNTER — Ambulatory Visit: Payer: BLUE CROSS/BLUE SHIELD | Admitting: *Deleted

## 2016-04-02 ENCOUNTER — Ambulatory Visit: Payer: BLUE CROSS/BLUE SHIELD | Admitting: *Deleted

## 2016-04-07 ENCOUNTER — Encounter: Payer: BLUE CROSS/BLUE SHIELD | Attending: Family Medicine | Admitting: *Deleted

## 2016-04-07 DIAGNOSIS — Z713 Dietary counseling and surveillance: Secondary | ICD-10-CM | POA: Insufficient documentation

## 2016-04-07 DIAGNOSIS — F5000 Anorexia nervosa, unspecified: Secondary | ICD-10-CM | POA: Insufficient documentation

## 2016-04-07 NOTE — Progress Notes (Signed)
Appointment start time: 1500  Appointment end time: 1545  Patient was seen on 04/07/16 for following nutrition counseling pertaining to anorexia nervosa  Assessment: No change  24 hour recall Apple Vegetable sandwich Mixed vegetables Yogurt, bread 5 supplements Hot chocolate milk  Estimated energy intake 1800  Estimated energy needs: 2500+ kcal  TANITA  BODY COMP RESULTS  09/09/15 11/12/15 12/10/15 01/14/16 02/25/16 04/07/16   BMI (kg/m^2) 17 17.9 17.7 18.1 18.5 18.5   Fat Mass (lbs) 16 23.5 22 23 25 25    Fat Free Mass (lbs) 99 98 98 99.5 100 100   Total Body Water (lbs) 72.5 71.5 71.5 73 73 73  %Body Fat 13.7 19.4 18.4 18.7 20 20.2    Nutrition diagnosis:  Inadequate calorie intake as related to eating disorder.  As evidenced by dietary recall   Intervention:     Nutrition counseling provided. Spent the bulk of the meeting discussing need for adequate care.  Provided motivational interviewing and challenged cognitive distortions Stressed need for carbohyrdates for brain function so that she can think more clearly Suggested body image group with Lubertha [facility]que[facility].  Harriett Sineancy initially declined stating it won't work, but did agree to at least call and get more information.  [employer] Embrace documentary  She agreed to try to add a slice of bread as a snack  Monitoring: patient will follow up in 2 weeks, AMA .  This provider recommended weekly visits

## 2016-04-21 ENCOUNTER — Encounter: Payer: BLUE CROSS/BLUE SHIELD | Attending: Family Medicine | Admitting: *Deleted

## 2016-04-21 DIAGNOSIS — Z713 Dietary counseling and surveillance: Secondary | ICD-10-CM | POA: Diagnosis not present

## 2016-04-21 DIAGNOSIS — F5000 Anorexia nervosa, unspecified: Secondary | ICD-10-CM | POA: Insufficient documentation

## 2016-04-21 DIAGNOSIS — F509 Eating disorder, unspecified: Secondary | ICD-10-CM

## 2016-04-21 NOTE — Patient Instructions (Signed)
Read those books  eat the bread   "Do or do not.  There is no try."  Oneita KrasYoda

## 2016-04-21 NOTE — Progress Notes (Signed)
Appointment start time: 1500  Appointment end time: 1545  Patient was seen on 04/21/16 for following nutrition counseling pertaining to anorexia nervosa  Assessment:  Watched Embrace and loved it Hasn't changed eating.  Feels safe with her current foods and is terrified of eating more.  Is afraid of buying new clothes    24 hour recall Apple Vegetable sandwich Mixed vegetables Yogurt, bread 5 supplements Hot chocolate milk  Estimated energy intake 1800  Estimated energy needs: 2500+ kcal  TANITA  BODY COMP RESULTS  09/09/15 11/12/15 12/10/15 01/14/16 02/25/16 04/07/16   BMI (kg/m^2) 17 17.9 17.7 18.1 18.5 18.5   Fat Mass (lbs) 16 23.5 22 23 25 25    Fat Free Mass (lbs) 99 98 98 99.5 100 100   Total Body Water (lbs) 72.5 71.5 71.5 73 73 73  %Body Fat 13.7 19.4 18.4 18.7 20 20.2    Nutrition diagnosis:  Inadequate calorie intake as related to eating disorder.  As evidenced by dietary recall   Intervention:     Nutrition counseling provided. Spent the bulk of the meeting discussing need for adequate care.  Provided motivational interviewing and challenged cognitive distortions Stressed need for carbohyrdates for brain function so that she can think more clearly.  She agreed to try to add 1 slice of bread each day Suggested Embrace and Embody books and to surround herself with positive messages in her home   She agreed to try to add a slice of bread as a snack  Monitoring: patient will follow up in 2 weeks, AMA .  This provider recommended weekly visits

## 2016-04-23 ENCOUNTER — Encounter: Payer: Self-pay | Admitting: *Deleted

## 2016-04-23 ENCOUNTER — Ambulatory Visit: Payer: BLUE CROSS/BLUE SHIELD | Admitting: *Deleted

## 2016-05-07 ENCOUNTER — Encounter: Payer: Self-pay | Admitting: *Deleted

## 2016-05-07 ENCOUNTER — Encounter: Payer: BLUE CROSS/BLUE SHIELD | Admitting: *Deleted

## 2016-05-07 DIAGNOSIS — F5000 Anorexia nervosa, unspecified: Secondary | ICD-10-CM | POA: Diagnosis not present

## 2016-05-07 DIAGNOSIS — E441 Mild protein-calorie malnutrition: Secondary | ICD-10-CM

## 2016-05-07 NOTE — Progress Notes (Signed)
Appointment start time: 1500  Appointment end time: 1600  Patient was seen on 05/07/16 for following nutrition counseling pertaining to anorexia nervosa  Assessment:  Bought Embrace book, but hasn't started it yet.  Is looking forward to her upcoming trip to [place].   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  Is working on some skills when she has negative thoughts.  Knows that her brain is starved and she can't rationalize things.  Has not made any dietary changes.  She is now keeping a notebook to take notes in from her therapy and nutrition sessions   24 hour recall Apple Vegetable sandwich Mixed vegetables Yogurt, bread 5 supplements Hot chocolate milk  Estimated energy intake 1800  Estimated energy needs: 2500+ kcal  TANITA  BODY COMP RESULTS  09/09/15 11/12/15 12/10/15 01/14/16 02/25/16 04/07/16   BMI (kg/m^2) 17 17.9 17.7 18.1 18.5 18.5   Fat Mass (lbs) 16 23.5 22 23 25 25    Fat Free Mass (lbs) 99 98 98 99.5 100 100   Total Body Water (lbs) 72.5 71.5 71.5 73 73 73  %Body Fat 13.7 19.4 18.4 18.7 20 20.2    Nutrition diagnosis:  Inadequate calorie intake as related to eating disorder.  As evidenced by dietary recall   Intervention:     Nutrition counseling provided. Provided motivational interviewing and challenged cognitive distortions.  Her goals for recovery are to feel productive and have meaning and get a job.  Discussed how Ed has taken that away from her and used it as motivation for recovery Stressed need for carbohydrates for brain function so that she can think more clearly.  She agreed to try to add 1 slice of bread each day (she agreed to do this ~1 month ago and hasn't been able to comply yet) Encouraged her to read Embrace and to surround herself with positive messages in her home   Monitoring: patient will follow up in 3 weeks, AMA .  This provider recommended weekly visits

## 2016-05-26 ENCOUNTER — Encounter: Payer: BLUE CROSS/BLUE SHIELD | Attending: Family Medicine | Admitting: *Deleted

## 2016-05-26 DIAGNOSIS — F509 Eating disorder, unspecified: Secondary | ICD-10-CM

## 2016-05-26 DIAGNOSIS — F5000 Anorexia nervosa, unspecified: Secondary | ICD-10-CM | POA: Insufficient documentation

## 2016-05-26 DIAGNOSIS — Z713 Dietary counseling and surveillance: Secondary | ICD-10-CM | POA: Insufficient documentation

## 2016-05-26 NOTE — Progress Notes (Signed)
Appointment start time: 1515  Appointment end time: 1600  Patient was seen on 6/617 for following nutrition counseling pertaining to anorexia nervosa  Assessment:  Finished reading Embrace and loved it.  Wants to read it again. Didn't get frozen yogurt with sister and didn't want to eat lunch with sister.  Thinks sister is frustrated with recovery process.  Feels like sister is "food police" Started keeping notebook and reads it when she struggles.  Food hasn't changed.     24 hour recall Apple Vegetable sandwich Mixed vegetables Yogurt, bread 5 supplements Hot chocolate milk  Estimated energy intake 1800  Estimated energy needs: 2500+ kcal  TANITA  BODY COMP RESULTS  09/09/15 11/12/15 12/10/15 01/14/16 02/25/16 04/07/16   BMI (kg/m^2) 17 17.9 17.7 18.1 18.5 18.5   Fat Mass (lbs) 16 23.5 22 23 25 25    Fat Free Mass (lbs) 99 98 98 99.5 100 100   Total Body Water (lbs) 72.5 71.5 71.5 73 73 73  %Body Fat 13.7 19.4 18.4 18.7 20 20.2    Nutrition diagnosis:  Inadequate calorie intake as related to eating disorder.  As evidenced by dietary recall   Intervention:     Nutrition counseling provided. Provided motivational interviewing and challenged cognitive distortions.  Recommended reading 8 Keys to Recovery, Embody, Life without Ed, and rereading her notes/journal when she's at home. Stressed need for carbohydrates for brain function so that she can think more clearly.  She agreed to try to add 1 slice of bread each day (she agreed to do this ~1 month ago and hasn't been able to comply yet)    Monitoring: patient will follow up in 2 weeks, AMA .  This provider recommended weekly visits

## 2016-06-09 ENCOUNTER — Encounter: Payer: BLUE CROSS/BLUE SHIELD | Admitting: *Deleted

## 2016-06-09 DIAGNOSIS — F5000 Anorexia nervosa, unspecified: Secondary | ICD-10-CM

## 2016-06-09 NOTE — Progress Notes (Signed)
Appointment start time: 1500  Appointment end time: 1600  Patient was seen on 06/09/16 for following nutrition counseling pertaining to anorexia nervosa  Assessment:  Read Life Without Ed.  Is able to see the different voices and quiet that voice more.  It's nice not to be hating herself.  It's also nice to not push herself not to have a career.  It's nice not to be beating herself up as much.  Ordered 8 Keys to Recovery also. Food hasn't changed.     24 hour recall Apple Vegetable sandwich Mixed vegetables Yogurt, bread 5 supplements Hot chocolate milk  Estimated energy intake 1800  Estimated energy needs: 2500+ kcal  TANITA  BODY COMP RESULTS  09/09/15 11/12/15 12/10/15 01/14/16 02/25/16 04/07/16 06/09/16   BMI (kg/m^2) 17 17.9 17.7 18.1 18.5 18.5 18.2   Fat Mass (lbs) 16 23.5 22 23 25 25  23.4   Fat Free Mass (lbs) 99 98 98 99.5 100 100 99.8   Total Body Water (lbs) 72.5 71.5 71.5 73 73 73 68.6  %Body Fat 13.7 19.4 18.4 18.7 20 20.2 19    Nutrition diagnosis:  Inadequate calorie intake as related to eating disorder.  As evidenced by dietary recall   Intervention:     Nutrition counseling provided. Provided motivational interviewing and challenged cognitive distortions.   Discussed Maslow's Hierarchy of Needs.  She needs food foremost.  She agreed to try an apple as a midmorning snack Wrote Declaration of Independence from Ed  Monitoring: patient will follow up in 2 weeks, AMA .  This provider recommended weekly visits

## 2016-06-25 ENCOUNTER — Ambulatory Visit: Payer: BLUE CROSS/BLUE SHIELD | Admitting: *Deleted

## 2016-06-30 ENCOUNTER — Ambulatory Visit: Payer: BLUE CROSS/BLUE SHIELD | Admitting: *Deleted

## 2016-07-07 ENCOUNTER — Encounter: Payer: BLUE CROSS/BLUE SHIELD | Attending: Family Medicine | Admitting: *Deleted

## 2016-07-07 ENCOUNTER — Encounter: Payer: Self-pay | Admitting: *Deleted

## 2016-07-07 VITALS — Wt 122.4 lb

## 2016-07-07 DIAGNOSIS — F5000 Anorexia nervosa, unspecified: Secondary | ICD-10-CM | POA: Insufficient documentation

## 2016-07-07 DIAGNOSIS — Z713 Dietary counseling and surveillance: Secondary | ICD-10-CM | POA: Insufficient documentation

## 2016-07-07 NOTE — Progress Notes (Signed)
Appointment start time: 1500  Appointment end time: 1545  Patient was seen on 07/07/16 for following nutrition counseling pertaining to anorexia nervosa  Assessment:  Enjoyed her trip to the [place]. Ate out a couple times and enjoyed it Read 8 Keys to Recovery from an Eating Disorder.  Didn't enjoy it, but did learn a lot.  Realized that her ED was a way to deal with social anxiety.  Feels she might re-read it Is going to try to be more open to other people Eating is unchanged   24 hour recall Apple Vegetable sandwich Mixed vegetables Yogurt, bread 5 supplements Hot chocolate milk  Estimated energy intake 1800  Estimated energy needs: 2500+ kcal  TANITA  BODY COMP RESULTS  09/09/15 11/12/15 12/10/15 01/14/16 04/07/16 07/07/16   BMI (kg/m^2) 17 17.9 17.7 18.1 18.5 18.1   Fat Mass (lbs) 16 23.5 22 23 25 21    Fat Free Mass (lbs) 99 98 98 99.5 100 101.4   Total Body Water (lbs) 72.5 71.5 71.5 73 73 69.6  %Body Fat 13.7 19.4 18.4 18.7 20.2 17.1    Nutrition diagnosis:  Inadequate calorie intake as related to eating disorder.  As evidenced by dietary recall   Intervention:     Nutrition counseling provided. Provided motivational interviewing and challenged cognitive distortions.   Revisisted Maslow's Hierarchy of Needs.  She needs food foremost.  She agreed to try an apple as a midmorning snack Encouraged her to read her Declaration of Independence from Ed.  Reaffirmed positive messages about food and recovery.  Suggested Goodbye Ed, Hello Me  Monitoring: patient will follow up in 2 weeks, AMA .  This provider recommended weekly visits

## 2016-07-21 ENCOUNTER — Encounter: Payer: BLUE CROSS/BLUE SHIELD | Attending: Family Medicine | Admitting: *Deleted

## 2016-07-21 DIAGNOSIS — F5000 Anorexia nervosa, unspecified: Secondary | ICD-10-CM | POA: Insufficient documentation

## 2016-07-21 DIAGNOSIS — Z713 Dietary counseling and surveillance: Secondary | ICD-10-CM | POA: Diagnosis not present

## 2016-07-21 NOTE — Patient Instructions (Addendum)
Legumes: lentils, soybeans/edamame or other soy product, tofu, tempeh, lima beans Grains: quinoa, fortified cereals, brown rice, oatmeal Nuts and seeds: pumpkin, squash, pine, pistacio, sunflower, cashews, unhulled sesame Vegetables: tomato sauce, swiss chard, collard greens, Other: blackstrap molasses, prune juice   Read Declaration daily- keep it in safe place Think about climbing down, not jumping off Tell Ed he's a lying snake and you want your life back

## 2016-07-21 NOTE — Progress Notes (Signed)
Appointment start time: 1500  Appointment end time: 1600  Patient was seen on 07/22/16 for following nutrition counseling pertaining to anorexia nervosa  Assessment:  Lab results reveal anemia. Is reading Goodby Ed, Hello Me and is getting some good insight.  However, has not made any changes in her eating    24 hour recall Vegetable sandwich Apple, yogurt, vegetables 5 supplements 2 hot chocolate Coffee with some skim milk  Estimated energy intake 1800  Estimated energy needs: 2500+ kcal  TANITA  BODY COMP RESULTS  09/09/15 11/12/15 12/10/15 01/14/16 04/07/16 07/07/16   BMI (kg/m^2) 17 17.9 17.7 18.1 18.5 18.1   Fat Mass (lbs) 16 23.5 22 23 25 21    Fat Free Mass (lbs) 99 98 98 99.5 100 101.4   Total Body Water (lbs) 72.5 71.5 71.5 73 73 69.6  %Body Fat 13.7 19.4 18.4 18.7 20.2 17.1    Nutrition diagnosis:  Inadequate calorie intake as related to eating disorder.  As evidenced by dietary recall   Intervention:     Nutrition counseling provided. Provided motivational interviewing and challenged cognitive distortions.   Reaffirmed positive messages about food and recovery.  Discussed vegetarian means to increase iron.  Stressed she really needs food.  Period.    Monitoring: patient will follow up in 2 weeks, AMA .  This provider recommended weekly visits

## 2016-08-06 ENCOUNTER — Encounter: Payer: BLUE CROSS/BLUE SHIELD | Admitting: *Deleted

## 2016-08-06 DIAGNOSIS — F5000 Anorexia nervosa, unspecified: Secondary | ICD-10-CM | POA: Diagnosis not present

## 2016-08-06 DIAGNOSIS — E441 Mild protein-calorie malnutrition: Secondary | ICD-10-CM

## 2016-08-06 NOTE — Progress Notes (Signed)
Appointment start time: 1500  Appointment end time: 1545  Patient was seen on 08/06/16 for following nutrition counseling pertaining to anorexia nervosa  Assessment:  Went to visit parents and that was a nice visit Tried soy beans Bought more almonds Added a second apple one day! Also challenged Ed voice when eating out.   24 hour recall Vegetable sandwich Coffee with skim milk Turnip roots with spinach, egg, cabbage,apple, yogurt, almonds Chocolate 5 supplements Hot chocolate Slice of bread  Estimated energy intake 1800  Estimated energy needs: 2500+ kcal  TANITA  BODY COMP RESULTS  09/09/15 11/12/15 12/10/15 01/14/16 04/07/16 07/07/16 08/06/16   BMI (kg/m^2) 17 17.9 17.7 18.1 18.5 18.1 18.1   Fat Mass (lbs) 16 23.5 22 23 25 21 20    Fat Free Mass (lbs) 99 98 98 99.5 100 101.4 102.8   Total Body Water (lbs) 72.5 71.5 71.5 73 73 69.6 70.6  %Body Fat 13.7 19.4 18.4 18.7 20.2 17.1 16.3    Nutrition diagnosis:  Inadequate calorie intake as related to eating disorder.  As evidenced by dietary recall   Intervention:     Nutrition counseling provided. Praised the effort she made to eat that extra apple.  Provided motivational interviewing and challenged cognitive distortions.   Reaffirmed positive messages about food and recovery.  Discussed vegetarian means to increase iron.  Stressed she really needs food.  Period.    Monitoring: patient will follow up in 2 weeks, AMA .  This provider recommended weekly visits

## 2016-08-20 ENCOUNTER — Encounter: Payer: BLUE CROSS/BLUE SHIELD | Admitting: *Deleted

## 2016-08-20 DIAGNOSIS — E46 Unspecified protein-calorie malnutrition: Secondary | ICD-10-CM

## 2016-08-20 DIAGNOSIS — F5000 Anorexia nervosa, unspecified: Secondary | ICD-10-CM

## 2016-08-20 NOTE — Progress Notes (Signed)
Appointment start time: 1400  Appointment end time: 1500  Patient was seen on 08/20/16 for following nutrition counseling pertaining to anorexia nervosa  Assessment:  Andrea Wright wonders if current therapist is the best fit as they seem to disagree on goals.  Andrea Wright would like to discuss her career and [name] would like to help her recover from her eating disorder.  Diet remains unchanged.  Feels like she's wasting time and she doesn't know who she even is anymore  24 hour recall Vegetable sandwich Coffee with skin milk Nuts 1/3 cup Edamame, zucchini, spinach, cabbage Bread, yogurt, apple 5 supplements Hot chocolate  chococlate  Estimated energy intake1800  Estimated energy needs: 2500+ kcal  TANITA  BODY COMP RESULTS  09/09/15 11/12/15 12/10/15 01/14/16 04/07/16 07/07/16 08/06/16   BMI (kg/m^2) 17 17.9 17.7 18.1 18.5 18.1 18.1   Fat Mass (lbs) 16 23.5 22 23 25 21 20    Fat Free Mass (lbs) 99 98 98 99.5 100 101.4 102.8   Total Body Water (lbs) 72.5 71.5 71.5 73 73 69.6 70.6  %Body Fat 13.7 19.4 18.4 18.7 20.2 17.1 16.3    Nutrition diagnosis:  Inadequate calorie intake as related to eating disorder.  As evidenced by dietary recall   Intervention:     Nutrition counseling provided.  Explored reasons for wanting to talk about career vs recovery.  What does Andrea SineNancy want vs Ed? What effort is she making?  How well can she focus on her career if her eating disorder is in charge? Recommended [facility] group    Monitoring: patient will follow up in 2 weeks, AMA .  This provider recommended weekly visits

## 2016-09-04 ENCOUNTER — Encounter: Payer: BLUE CROSS/BLUE SHIELD | Attending: Family Medicine | Admitting: *Deleted

## 2016-09-04 DIAGNOSIS — Z713 Dietary counseling and surveillance: Secondary | ICD-10-CM | POA: Diagnosis not present

## 2016-09-04 DIAGNOSIS — F5000 Anorexia nervosa, unspecified: Secondary | ICD-10-CM | POA: Insufficient documentation

## 2016-09-04 DIAGNOSIS — E46 Unspecified protein-calorie malnutrition: Secondary | ICD-10-CM

## 2016-09-04 NOTE — Progress Notes (Signed)
Appointment start time: 0900  Appointment end time: 1000  Patient was seen on 09/04/16 for following nutrition counseling pertaining to anorexia nervosa  Assessment:  [place].  Has made no dietary changes.  Is ready Embody and has some good take away points, but has not been able to implement changes in her life.  She has decided not to participate in the recommended group therapy as she doesn't think it will help.  Harriett Sineancy continues to be very ambivalent about recovery.    She states she wants her eating disorder to go away, but she is terrified of making changes.  She worries she will "lose control, eat too much, and gain too much weight."  Her parents and sisters are slender and her genetics would not suggest that she has the potential to be fat     24 hour recall Vegetable sandwich Coffee with skin milk Nuts 1/3 cup Edamame, zucchini, spinach, cabbage Bread, yogurt, apple 5 supplements Hot chocolate  chococlate  Estimated energy intake1800  Estimated energy needs: 2500+ kcal  TANITA  BODY COMP RESULTS  09/09/15 11/12/15 12/10/15 01/14/16 04/07/16 07/07/16 08/06/16   BMI (kg/m^2) 17 17.9 17.7 18.1 18.5 18.1 18.1   Fat Mass (lbs) 16 23.5 22 23 25 21 20    Fat Free Mass (lbs) 99 98 98 99.5 100 101.4 102.8   Total Body Water (lbs) 72.5 71.5 71.5 73 73 69.6 70.6  %Body Fat 13.7 19.4 18.4 18.7 20.2 17.1 16.3    Nutrition diagnosis:  Inadequate calorie intake as related to eating disorder.  As evidenced by dietary recall   Intervention:     Nutrition counseling provided. Discussed motivation to change.  Challenged cognitive distortions.  Provided motivational interviewing.  Asked her to think about if she really wants to continue recovery treatment   Monitoring: patient will follow up in 2 weeks, AMA .  This provider recommended weekly visits

## 2016-09-17 ENCOUNTER — Telehealth: Payer: Self-pay | Admitting: *Deleted

## 2016-09-17 NOTE — Telephone Encounter (Signed)
Focusing and concentrating.  Unless changes are made to her nutrition, employment may be difficult

## 2016-09-17 NOTE — Telephone Encounter (Signed)
Vocational counselor from [employer] calling to check in on Andrea Wright's progress and she hopes to gradually reenter the workplace.   Relayed that Andrea Wright is not making nutritional progress, is medically underweight, fatigued, and has difficulty

## 2016-09-18 ENCOUNTER — Encounter: Payer: BLUE CROSS/BLUE SHIELD | Admitting: *Deleted

## 2016-09-18 DIAGNOSIS — F5000 Anorexia nervosa, unspecified: Secondary | ICD-10-CM

## 2016-09-18 DIAGNOSIS — E46 Unspecified protein-calorie malnutrition: Secondary | ICD-10-CM

## 2016-09-18 NOTE — Progress Notes (Signed)
Appointment start time: 0900  Appointment end time: 0930  Patient was seen on 09/18/16 for following nutrition counseling pertaining to anorexia nervosa  Assessment:  Went to the beach and that was fun Had annual with OB-GYN and needs to schedule bone density test.   Ate an additional apple twice , but has made no further progress.  Andrea Wright has made no real progress in over a year.  She does not appear committed to recovery.     24 hour recall Vegetable sandwich Coffee with skin milk Nuts 1/3 cup Edamame, zucchini, spinach, cabbage Bread, yogurt, apple 5 supplements Hot chocolate  chococlate  Estimated energy intake1800  Estimated energy needs: 2500+ kcal  TANITA  BODY COMP RESULTS  09/09/15 11/12/15 12/10/15 01/14/16 04/07/16 07/07/16 08/06/16   BMI (kg/m^2) 17 17.9 17.7 18.1 18.5 18.1 18.1   Fat Mass (lbs) 16 23.5 22 23 25 21 20    Fat Free Mass (lbs) 99 98 98 99.5 100 101.4 102.8   Total Body Water (lbs) 72.5 71.5 71.5 73 73 69.6 70.6  %Body Fat 13.7 19.4 18.4 18.7 20.2 17.1 16.3    Nutrition diagnosis:  Inadequate calorie intake as related to eating disorder.  As evidenced by dietary recall   Intervention:     Nutrition counseling provided. Discussed motivation to change.  Andrea Wright was not able to verbalize motivation to change and will not continue nutrition counseling  Monitoring: patient will follow up prn, when ready

## 2016-11-23 ENCOUNTER — Ambulatory Visit (INDEPENDENT_AMBULATORY_CARE_PROVIDER_SITE_OTHER): Payer: BLUE CROSS/BLUE SHIELD | Admitting: Physician Assistant

## 2016-11-23 VITALS — BP 110/74 | HR 74 | Temp 98.4°F | Resp 16 | Ht 69.0 in | Wt 114.2 lb

## 2016-11-23 DIAGNOSIS — F5000 Anorexia nervosa, unspecified: Secondary | ICD-10-CM | POA: Diagnosis not present

## 2016-11-23 NOTE — Patient Instructions (Signed)
     IF you received an x-ray today, you will receive an invoice from [facility]. Please contact [facility] at [phone] with questions or concerns regarding your invoice.   IF you received labwork today, you will receive an invoice from [facility]. Please contact [facility] at [phone] with questions or concerns regarding your invoice.   Our billing staff will not be able to assist you with questions regarding bills from these companies.  You will be contacted with the lab results as soon as they are available. The fastest way to get your results is to activate your My Chart account. Instructions are located on the last page of this paperwork. If you have not heard from us regarding the results in 2 weeks, please contact this office.      

## 2016-11-23 NOTE — Progress Notes (Signed)
Patient ID: Andrea Wright, female     DOB: 06/07/1966, 50 y.o.    MRN: [id number]  PCP: Gaye AlkenBARNES,ELIZABETH STEWART, MD  Chief Complaint  Patient presents with  . Follow-up    weight and bp    Subjective:   This patient is new to this practice and presents for weight and BP check. Referred by Mathis DadBrett Debney, LPC.  She is in treatment for anorexia nervosa, and [name] has advised her to have her weight and BP checked every 2 weeks. She was referred here when her PCP reportedly would not allow her to have this done at her office. It sounds as though there is an issue with whether the patient can been seen for nurse visit or has to see the provider, but it's not clear.  Previously seen by Danise EdgeLaura Watson, RD, last visit 09/18/2016. At that time she was unable to verbalize motivation to change and was to schedule follow-up when she was ready.  Review of Systems Not performed. Patient is certain that she only needs weight and BP check.  Prior to Admission medications   Medication Sig Start Date End Date Taking? Authorizing Provider  cholecalciferol (VITAMIN D) 1000 units tablet Take 1,000 Units by mouth daily.   Yes Historical Provider, MD  cyanocobalamin 100 MCG tablet Take 100 mcg by mouth daily.   Yes Historical Provider, MD  Dietary Management Product (ENLYTE PO) Take by mouth.   Yes Historical Provider, MD  Flaxseed, Linseed, (FLAX SEED OIL) 1000 MG CAPS Take by mouth.   Yes Historical Provider, MD  FLUoxetine (PROZAC) 20 MG capsule Take 80 mg by mouth.  04/16/15  Yes Historical Provider, MD  Lactobacillus Rhamnosus, GG, (PROBIOTIC COLIC PO) Take by mouth.   Yes Historical Provider, MD  magnesium 30 MG tablet Take 30 mg by mouth 2 (two) times daily.   Yes Historical Provider, MD  Multiple Vitamin (MULTIVITAMIN) tablet Take 1 tablet by mouth daily.   Yes Historical Provider, MD  LORazepam (ATIVAN) 0.5 MG tablet Take 0.5 mg by mouth. 04/29/15   Historical Provider, MD  OLANZapine (ZYPREXA) 7.5 MG  tablet Take 7.5 mg by mouth. 05/07/15 06/06/15  Historical Provider, MD  ranitidine (ZANTAC) 150 MG tablet Take 150 mg by mouth.    Historical Provider, MD  traZODone (DESYREL) 50 MG tablet Take 50 mg by mouth. 05/02/15 06/01/15  Historical Provider, MD     Allergies  Allergen Reactions  . Ambien [Zolpidem Tartrate]   . Lunesta [Eszopiclone]   . Zolpidem Tartrate     REACTION: hallucinations, sleep walks     Patient Active Problem List   Diagnosis Date Noted  . HEMORRHOIDS, INTERNAL 11/18/2010  . GERD 05/11/2008  . CONSTIPATION 05/11/2008  . RECTAL PAIN 05/11/2008  . OSTEOPOROSIS 05/11/2008  . DEPRESSION, HX OF 05/11/2008  . RAYNAUD'S SYNDROME, HX OF 05/11/2008  . ANOREXIA, HX OF 04/09/2008  . MEGACOLON 02/13/1997     Family History  Problem Relation Age of Onset  . Heart disease Paternal Grandfather   . Hypertension Mother   . Hypertension Father      Social History   Social History  . Marital status: Single    Spouse name: N/A  . Number of children: 0  . Years of education: Master's in Business   Occupational History  . retired      [employer]   Social History Main Topics  . Smoking status: Never Smoker  . Smokeless tobacco: Never Used  . Alcohol use No  .  Drug use: No  . Sexual activity: Not on file   Other Topics Concern  . Not on file   Social History Narrative   Lives alone.   Sister lives in [place], [place].   Parents live in [place], [place].         Objective:  Physical Exam  Constitutional: She is oriented to person, place, and time. She appears well-developed and well-nourished. She is active and cooperative. No distress.  BP 110/74   Pulse 74   Temp 98.4 F (36.9 C)   Resp 16   Ht 5\' 9"  (1.753 m)   Wt 114 lb 3.2 oz (51.8 kg)   SpO2 100%   BMI 16.86 kg/m    Eyes: Conjunctivae are normal.  Pulmonary/Chest: Effort normal.  Neurological: She is alert and oriented to person, place, and time.  Psychiatric: She has a normal  mood and affect. Her speech is normal and behavior is normal.    Wt Readings from Last 3 Encounters:  11/23/16 114 lb 3.2 oz (51.8 kg)  07/07/16 122 lb 6.4 oz (55.5 kg)  03/10/16 125 lb (56.7 kg)            Assessment & Plan:  1. Anorexia nervosa Weight and BP provided. Will forward to Ms. Debney, per the patient's request. Encouraged her to contact her PCP, as it would be best to have the check done there. If that is not an option, I believe that we could work out a plan for the checks here.   Fernande Brashelle S. Jamaal Bernasconi, PA-C Physician Assistant-Certified [facility]

## 2016-12-08 ENCOUNTER — Ambulatory Visit: Payer: BLUE CROSS/BLUE SHIELD | Admitting: Physician Assistant

## 2017-10-13 ENCOUNTER — Telehealth: Payer: Self-pay

## 2017-10-13 NOTE — Telephone Encounter (Signed)
SENT NOTES TO SCHEDULING 

## 2017-10-15 NOTE — Telephone Encounter (Signed)
10/15/2017 [facility] with Dr. Duke Salviaandolph.  [facility]  cbr

## 2017-11-05 ENCOUNTER — Ambulatory Visit (INDEPENDENT_AMBULATORY_CARE_PROVIDER_SITE_OTHER): Payer: BLUE CROSS/BLUE SHIELD | Admitting: Cardiovascular Disease

## 2017-11-05 ENCOUNTER — Telehealth: Payer: Self-pay | Admitting: Cardiovascular Disease

## 2017-11-05 ENCOUNTER — Encounter: Payer: Self-pay | Admitting: Cardiovascular Disease

## 2017-11-05 VITALS — BP 124/85 | HR 69 | Ht 69.0 in | Wt 112.0 lb

## 2017-11-05 DIAGNOSIS — R9431 Abnormal electrocardiogram [ECG] [EKG]: Secondary | ICD-10-CM | POA: Diagnosis not present

## 2017-11-05 NOTE — Progress Notes (Signed)
Cardiology Office Note   Date:  11/05/2017   ID:  Andrea Wright, DOB 01/31/1966, MRN [id number]  PCP:  Andrea Wright, Elizabeth, MD  Cardiologist:   Andrea Siiffany Midway, MD   Chief Complaint  Patient presents with  . New Patient (Initial Visit)    ABNORMAL EKG, PT HAS NO SYMPTOMS      History of Present Illness: Andrea Wright is a 51 y.o. female with anorexia, GERD, hyponatremia and SIADH who is being seen today for the evaluation of an abnormal EKG at the request of Andrea Wright, Elizabeth, MD.  Andrea Wright saw Andrea Wright for a routine appointment and had an EKG that showed T wave inversions in leads V1.  She was referred to cardiology for further evaluation.  She denies chest pain or shortness of breath.  She exercises daily by walking for 45 minutes and ride a bike for 30 minutes.  She occasionally has cramping on the left side of her chest that typically occurs when she is sitting in bed reading.  She had palpitations in the past when suffering from panic attacks but none recently.  She has no lower extremity edema, orthopnea, or PND.  She reports that her anorexia has been fairly well-controlled and her weight has been stable for the last 3 weeks.   Past Medical History:  Diagnosis Date  . Anorexia   . Eating disorder   . Osteoporosis     Past Surgical History:  Procedure Laterality Date  . CERVICAL FUSION     c5-c6  . COLECTOMY    . SPINE SURGERY       Current Outpatient Medications  Medication Sig Dispense Refill  . Biotin 1 MG CAPS Take 1 capsule daily by mouth.    . cholecalciferol (VITAMIN D) 1000 units tablet Take 1,000 Units by mouth daily.    . cyanocobalamin 100 MCG tablet Take 100 mcg by mouth daily.    . Dietary Management Product (ENLYTE PO) Take by mouth.    . Flaxseed, Linseed, (FLAX SEED OIL) 1000 MG CAPS Take by mouth.    Marland Kitchen. FLUoxetine (PROZAC) 20 MG capsule Take 80 mg by mouth.     Marland Kitchen. FLUoxetine (PROZAC) 40 MG capsule Take 80 mg daily by mouth.    . hydrocortisone  (ANUSOL-HC) 25 MG suppository Place 25 mg 2 (two) times daily rectally.    . Lactobacillus Rhamnosus, GG, (PROBIOTIC COLIC PO) Take by mouth.    Marland Kitchen. LORazepam (ATIVAN) 0.5 MG tablet Take 0.5 mg by mouth.    Marland Kitchen. LORazepam (ATIVAN) 0.5 MG tablet Take 5 mg as needed by mouth.    . magnesium 30 MG tablet Take 30 mg by mouth 2 (two) times daily.    . Multiple Vitamin (MULTIVITAMIN) tablet Take 1 tablet by mouth daily.    . polyethylene glycol (MIRALAX / GLYCOLAX) packet Take 17 g 2 (two) times daily by mouth.    . ranitidine (ZANTAC) 150 MG tablet Take 150 mg by mouth.    . simethicone (MYLICON) 80 MG chewable tablet Chew 80 mg as needed by mouth.    Marland Kitchen. glucosamine-chondroitin 500-400 MG tablet Take 1 tablet daily by mouth.    . Lactobacillus (PROBIOTIC ACIDOPHILUS) CAPS Take 1 capsule daily by mouth.    . OLANZapine (ZYPREXA) 7.5 MG tablet Take 7.5 mg by mouth.    . traZODone (DESYREL) 50 MG tablet Take 50 mg by mouth.    Andrea Fret. YUVAFEM 10 MCG TABS vaginal tablet 2 (two) times a week.  No current facility-administered medications for this visit.     Allergies:   Ambien [zolpidem tartrate] and Lunesta [eszopiclone]    Social History:  The patient  reports that  has never smoked. she has never used smokeless tobacco. She reports that she does not drink alcohol or use drugs.   Family History:  The patient's family history includes Heart attack in her paternal grandfather; Hypertension in her father and mother; Stroke in her paternal grandmother.    ROS:  Please see the history of present illness.   Otherwise, review of systems are positive for none.   All other systems are reviewed and negative.    PHYSICAL EXAM: VS:  BP 124/85   Pulse 69   Ht 5\' 9"  (1.753 m)   Wt 50.8 kg (112 lb)   BMI 16.54 kg/m  , BMI Body mass index is 16.54 kg/m. GENERAL:  Cachectic appearing.  No acute distress HEENT:  Pupils equal round and reactive, fundi not visualized, oral mucosa unremarkable NECK:  No jugular  venous distention, waveform within normal limits, carotid upstroke brisk and symmetric, no bruits, no thyromegaly LYMPHATICS:  No cervical adenopathy LUNGS:  Clear to auscultation bilaterally HEART:  RRR.  PMI not displaced or sustained,S1 and S2 within normal limits, no S3, no S4, no clicks, no rubs, no murmurs ABD:  Flat, positive bowel sounds normal in frequency in pitch, no bruits, no rebound, no guarding, no midline pulsatile mass, no hepatomegaly, no splenomegaly EXT:  2 plus pulses throughout, no edema, no cyanosis no clubbing SKIN:  No rashes no nodules NEURO:  Cranial nerves II through XII grossly intact, motor grossly intact throughout PSYCH:  Cognitively intact, oriented to person place and time    EKG:  EKG is not ordered today. The ekg ordered 10/12/17 demonstrates sinus rhythm.  Rate 63 bpm.  TWI V1 and V2.    Recent Labs: No results found for requested labs within last 8760 hours.   10/01/17: WBC 5.4, hemoglobin 14.3, hematocrit 41.6, platelets 308 Sodium 137, potassium 4.6, BUN 17, creatinine 1.0 AST 27, ALT 23 Total cholesterol 208, triglycerides 92, HDL 63, LDL 127  Lipid Panel No results found for: CHOL, TRIG, HDL, CHOLHDL, VLDL, LDLCALC, LDLDIRECT    Wt Readings from Last 3 Encounters:  11/05/17 50.8 kg (112 lb)  11/23/16 51.8 kg (114 lb 3.2 oz)  07/07/16 55.5 kg (122 lb 6.4 oz)      ASSESSMENT AND PLAN:  # Abnormal EKG: TWI noted in leads V1/V2.  She has no exertional symptoms and this can be normal variant.  No futher work up.   # Hyperlipidemia:  ASCVD 10 year risk 1.3%.  No aspirin or statin at this time.     Current medicines are reviewed at length with the patient today.  The patient does not have concerns regarding medicines.  The following changes have been made:  no change  Labs/ tests ordered today include:  No orders of the defined types were placed in this encounter.    Disposition:   FU with Andrea Brugger C. Duke Salviaandolph, MD, [facility] as needed.       This note was written with the assistance of speech recognition software.  Please excuse any transcriptional errors.  Signed, Dearis Danis C. Duke Salviaandolph, MD, [facility]  11/05/2017 4:51 PM    [facility]

## 2017-11-05 NOTE — Patient Instructions (Signed)
Medication Instructions:  Your physician recommends that you continue on your current medications as directed. Please refer to the Current Medication list given to you today.  Labwork: NONE  Testing/Procedures: NONE  Follow-Up: As needed

## 2017-11-25 ENCOUNTER — Encounter: Payer: Self-pay | Admitting: Family Medicine

## 2018-06-21 DIAGNOSIS — H5213 Myopia, bilateral: Secondary | ICD-10-CM | POA: Diagnosis not present

## 2018-09-23 DIAGNOSIS — Z01419 Encounter for gynecological examination (general) (routine) without abnormal findings: Secondary | ICD-10-CM | POA: Diagnosis not present

## 2018-09-23 DIAGNOSIS — Z1231 Encounter for screening mammogram for malignant neoplasm of breast: Secondary | ICD-10-CM | POA: Diagnosis not present

## 2018-09-26 DIAGNOSIS — F5000 Anorexia nervosa, unspecified: Secondary | ICD-10-CM | POA: Diagnosis not present

## 2018-09-26 DIAGNOSIS — M810 Age-related osteoporosis without current pathological fracture: Secondary | ICD-10-CM | POA: Diagnosis not present

## 2018-09-26 DIAGNOSIS — D649 Anemia, unspecified: Secondary | ICD-10-CM | POA: Diagnosis not present

## 2018-09-29 DIAGNOSIS — E871 Hypo-osmolality and hyponatremia: Secondary | ICD-10-CM | POA: Diagnosis not present

## 2018-09-29 DIAGNOSIS — M8589 Other specified disorders of bone density and structure, multiple sites: Secondary | ICD-10-CM | POA: Diagnosis not present

## 2018-10-18 DIAGNOSIS — D649 Anemia, unspecified: Secondary | ICD-10-CM | POA: Diagnosis not present

## 2018-10-18 DIAGNOSIS — M810 Age-related osteoporosis without current pathological fracture: Secondary | ICD-10-CM | POA: Diagnosis not present

## 2018-10-18 DIAGNOSIS — Z0000 Encounter for general adult medical examination without abnormal findings: Secondary | ICD-10-CM | POA: Diagnosis not present

## 2018-10-18 DIAGNOSIS — F5000 Anorexia nervosa, unspecified: Secondary | ICD-10-CM | POA: Diagnosis not present

## 2019-06-22 DIAGNOSIS — Z0120 Encounter for dental examination and cleaning without abnormal findings: Secondary | ICD-10-CM | POA: Diagnosis not present

## 2019-07-20 DIAGNOSIS — H5213 Myopia, bilateral: Secondary | ICD-10-CM | POA: Diagnosis not present

## 2019-10-05 DIAGNOSIS — Z01419 Encounter for gynecological examination (general) (routine) without abnormal findings: Secondary | ICD-10-CM | POA: Diagnosis not present

## 2019-10-05 DIAGNOSIS — Z1231 Encounter for screening mammogram for malignant neoplasm of breast: Secondary | ICD-10-CM | POA: Diagnosis not present

## 2019-10-12 ENCOUNTER — Other Ambulatory Visit: Payer: Self-pay | Admitting: Obstetrics and Gynecology

## 2019-10-12 DIAGNOSIS — R922 Inconclusive mammogram: Secondary | ICD-10-CM

## 2019-10-12 DIAGNOSIS — M810 Age-related osteoporosis without current pathological fracture: Secondary | ICD-10-CM | POA: Diagnosis not present

## 2019-10-12 DIAGNOSIS — K219 Gastro-esophageal reflux disease without esophagitis: Secondary | ICD-10-CM | POA: Diagnosis not present

## 2019-10-12 DIAGNOSIS — E7800 Pure hypercholesterolemia, unspecified: Secondary | ICD-10-CM | POA: Diagnosis not present

## 2019-10-12 DIAGNOSIS — R7301 Impaired fasting glucose: Secondary | ICD-10-CM | POA: Diagnosis not present

## 2019-10-20 ENCOUNTER — Other Ambulatory Visit: Payer: BLUE CROSS/BLUE SHIELD

## 2020-03-08 DIAGNOSIS — F5000 Anorexia nervosa, unspecified: Secondary | ICD-10-CM | POA: Diagnosis not present

## 2020-03-08 DIAGNOSIS — K219 Gastro-esophageal reflux disease without esophagitis: Secondary | ICD-10-CM | POA: Diagnosis not present

## 2020-03-08 DIAGNOSIS — Z681 Body mass index (BMI) 19 or less, adult: Secondary | ICD-10-CM | POA: Diagnosis not present

## 2020-03-08 DIAGNOSIS — Z0000 Encounter for general adult medical examination without abnormal findings: Secondary | ICD-10-CM | POA: Diagnosis not present

## 2020-03-22 ENCOUNTER — Ambulatory Visit: Payer: BLUE CROSS/BLUE SHIELD | Attending: Internal Medicine

## 2020-03-22 DIAGNOSIS — Z23 Encounter for immunization: Secondary | ICD-10-CM

## 2020-03-22 NOTE — Progress Notes (Signed)
   Covid-19 Vaccination Clinic  Name:  Andrea Wright    MRN: [id number] DOB: [birth year]  03/22/2020  Andrea Wright was observed post Covid-19 immunization for 15 minutes without incident. She was provided with Vaccine Information Sheet and instruction to access the V-Safe system.   Andrea Wright was instructed to call 911 with any severe reactions post vaccine: Marland Kitchen Difficulty breathing  . Swelling of face and throat  . A fast heartbeat  . A bad rash all over body  . Dizziness and weakness   Immunizations Administered    Name Date Dose VIS Date Route   Pfizer COVID-19 Vaccine 03/22/2020  4:25 PM 0.3 mL 12/01/2019 Intramuscular   Manufacturer: [employer], [employer]   Lot: [id number]   NDC: [address]-2

## 2020-04-15 ENCOUNTER — Ambulatory Visit: Payer: BLUE CROSS/BLUE SHIELD | Attending: Internal Medicine

## 2020-04-15 DIAGNOSIS — Z23 Encounter for immunization: Secondary | ICD-10-CM

## 2020-04-15 NOTE — Progress Notes (Signed)
   Covid-19 Vaccination Clinic  Name:  Andrea Wright    MRN: [id number] DOB: 1966-12-12  04/15/2020  [name] was observed post Covid-19 immunization for 15 minutes without incident. She was provided with Vaccine Information Sheet and instruction to access the V-Safe system.   [name] was instructed to call 911 with any severe reactions post vaccine: Marland Kitchen Difficulty breathing  . Swelling of face and throat  . A fast heartbeat  . A bad rash all over body  . Dizziness and weakness   Immunizations Administered    Name Date Dose VIS Date Route   Pfizer COVID-19 Vaccine 04/15/2020  3:43 PM 0.3 mL 02/14/2019 Intramuscular   Manufacturer: [employer], [employer]   Lot: [id number]   NDC: [address]-9

## 2020-06-13 DIAGNOSIS — Z0120 Encounter for dental examination and cleaning without abnormal findings: Secondary | ICD-10-CM | POA: Diagnosis not present

## 2020-07-15 DIAGNOSIS — H5213 Myopia, bilateral: Secondary | ICD-10-CM | POA: Diagnosis not present

## 2020-10-25 DIAGNOSIS — Z1231 Encounter for screening mammogram for malignant neoplasm of breast: Secondary | ICD-10-CM | POA: Diagnosis not present

## 2020-10-25 DIAGNOSIS — Z01419 Encounter for gynecological examination (general) (routine) without abnormal findings: Secondary | ICD-10-CM | POA: Diagnosis not present

## 2020-11-05 DIAGNOSIS — L821 Other seborrheic keratosis: Secondary | ICD-10-CM | POA: Diagnosis not present

## 2020-11-05 DIAGNOSIS — L578 Other skin changes due to chronic exposure to nonionizing radiation: Secondary | ICD-10-CM | POA: Diagnosis not present

## 2020-11-05 DIAGNOSIS — D692 Other nonthrombocytopenic purpura: Secondary | ICD-10-CM | POA: Diagnosis not present

## 2020-11-05 DIAGNOSIS — D225 Melanocytic nevi of trunk: Secondary | ICD-10-CM | POA: Diagnosis not present

## 2021-01-31 ENCOUNTER — Encounter: Payer: Self-pay | Admitting: Gastroenterology

## 2021-02-10 ENCOUNTER — Encounter: Payer: Self-pay | Admitting: Gastroenterology

## 2021-03-07 DIAGNOSIS — D649 Anemia, unspecified: Secondary | ICD-10-CM | POA: Diagnosis not present

## 2021-03-07 DIAGNOSIS — E7800 Pure hypercholesterolemia, unspecified: Secondary | ICD-10-CM | POA: Diagnosis not present

## 2021-03-13 DIAGNOSIS — M810 Age-related osteoporosis without current pathological fracture: Secondary | ICD-10-CM | POA: Diagnosis not present

## 2021-03-13 DIAGNOSIS — Z0000 Encounter for general adult medical examination without abnormal findings: Secondary | ICD-10-CM | POA: Diagnosis not present

## 2021-03-13 DIAGNOSIS — D649 Anemia, unspecified: Secondary | ICD-10-CM | POA: Diagnosis not present

## 2021-03-13 DIAGNOSIS — Z1211 Encounter for screening for malignant neoplasm of colon: Secondary | ICD-10-CM | POA: Diagnosis not present

## 2021-04-01 ENCOUNTER — Other Ambulatory Visit: Payer: Self-pay

## 2021-04-01 ENCOUNTER — Ambulatory Visit (AMBULATORY_SURGERY_CENTER): Payer: Self-pay | Admitting: *Deleted

## 2021-04-01 VITALS — Ht 69.0 in | Wt 98.0 lb

## 2021-04-01 DIAGNOSIS — Z1211 Encounter for screening for malignant neoplasm of colon: Secondary | ICD-10-CM

## 2021-04-01 MED ORDER — SUTAB 1479-225-188 MG PO TABS: 1.0000 | ORAL_TABLET | Freq: Once | ORAL | 0 refills | Status: AC

## 2021-04-01 NOTE — Progress Notes (Signed)
No egg or soy allergy known to patient  No issues with past sedation with any surgeries or procedures Patient denies ever being told they had issues or difficulty with intubation  No FH of Malignant Hyperthermia No diet pills per patient No home 02 use per patient  No blood thinners per patient  Pt denies issues with constipation  No A fib or A flutter  EMMI video to pt or via MyChart  COVID 19 guidelines implemented in PV today with Pt and RN  Pt is fully vaccinated  for Covid   Coupon given to pt in PV today , Code to Pharmacy and  NO PA's for preps discussed with pt In PV today  Discussed with pt there will be an out-of-pocket cost for prep and that varies from $0 to 70 dollars   Due to the COVID-19 pandemic we are asking patients to follow certain guidelines.  Pt aware of COVID protocols and [facility] guidelines  

## 2021-04-09 ENCOUNTER — Encounter: Payer: Self-pay | Admitting: Gastroenterology

## 2021-04-15 ENCOUNTER — Other Ambulatory Visit: Payer: Self-pay | Admitting: Gastroenterology

## 2021-04-15 ENCOUNTER — Other Ambulatory Visit: Payer: Self-pay

## 2021-04-15 ENCOUNTER — Ambulatory Visit (AMBULATORY_SURGERY_CENTER): Payer: Medicare Other | Admitting: Gastroenterology

## 2021-04-15 ENCOUNTER — Encounter: Payer: Self-pay | Admitting: Gastroenterology

## 2021-04-15 VITALS — BP 148/92 | HR 49 | Temp 96.8°F | Resp 9 | Ht 69.0 in | Wt 98.0 lb

## 2021-04-15 DIAGNOSIS — Z8371 Family history of colonic polyps: Secondary | ICD-10-CM | POA: Diagnosis not present

## 2021-04-15 DIAGNOSIS — K5000 Crohn's disease of small intestine without complications: Secondary | ICD-10-CM | POA: Diagnosis not present

## 2021-04-15 DIAGNOSIS — Z800 Family history of malignant neoplasm of digestive organs: Secondary | ICD-10-CM

## 2021-04-15 DIAGNOSIS — K50018 Crohn's disease of small intestine with other complication: Secondary | ICD-10-CM | POA: Diagnosis not present

## 2021-04-15 DIAGNOSIS — Z1211 Encounter for screening for malignant neoplasm of colon: Secondary | ICD-10-CM | POA: Diagnosis not present

## 2021-04-15 MED ORDER — SODIUM CHLORIDE 0.9 % IV SOLN: 500.0000 mL | Freq: Once | INTRAVENOUS | Status: DC

## 2021-04-15 NOTE — Progress Notes (Signed)
PT taken to PACU. Monitors in place. VSS. Report given to RN. 

## 2021-04-15 NOTE — Patient Instructions (Signed)
Resume previous diet Continue current medications Await pathology results  YOU HAD AN ENDOSCOPIC PROCEDURE TODAY AT THE [facility]:   Refer to the procedure report that was given to you for any specific questions about what was found during the examination.  If the procedure report does not answer your questions, please call your gastroenterologist to clarify.  If you requested that your care partner not be given the details of your procedure findings, then the procedure report has been included in a sealed envelope for you to review at your convenience later.  YOU SHOULD EXPECT: Some feelings of bloating in the abdomen. Passage of more gas than usual.  Walking can help get rid of the air that was put into your GI tract during the procedure and reduce the bloating. If you had a lower endoscopy (such as a colonoscopy or flexible sigmoidoscopy) you may notice spotting of blood in your stool or on the toilet paper. If you underwent a bowel prep for your procedure, you may not have a normal bowel movement for a few days.  Please Note:  You might notice some irritation and congestion in your nose or some drainage.  This is from the oxygen used during your procedure.  There is no need for concern and it should clear up in a day or so.  SYMPTOMS TO REPORT IMMEDIATELY:  Following lower endoscopy (colonoscopy or flexible sigmoidoscopy):  Excessive amounts of blood in the stool  Significant tenderness or worsening of abdominal pains  Swelling of the abdomen that is new, acute  Fever of 100F or higher  For urgent or emergent issues, a gastroenterologist can be reached at any hour by calling [phone]. Do not use MyChart messaging for urgent concerns.   DIET:  We do recommend a small meal at first, but then you may proceed to your regular diet.  Drink plenty of fluids but you should avoid alcoholic beverages for 24 hours.  ACTIVITY:  You should plan to take it easy for the rest of  today and you should NOT DRIVE or use heavy machinery until tomorrow (because of the sedation medicines used during the test).    FOLLOW UP: Our staff will call the number listed on your records 48-72 hours following your procedure to check on you and address any questions or concerns that you may have regarding the information given to you following your procedure. If we do not reach you, we will leave a message.  We will attempt to reach you two times.  During this call, we will ask if you have developed any symptoms of COVID 19. If you develop any symptoms (ie: fever, flu-like symptoms, shortness of breath, cough etc.) before then, please call [phone].  If you test positive for Covid 19 in the 2 weeks post procedure, please call and report this information to us.    If any biopsies were taken you will be contacted by phone or by letter within the next 1-3 weeks.  Please call us at [phone] if you have not heard about the biopsies in 3 weeks.   SIGNATURES/CONFIDENTIALITY: You and/or your care partner have signed paperwork which will be entered into your electronic medical record.  These signatures attest to the fact that that the information above on your After Visit Summary has been reviewed and is understood.  Full responsibility of the confidentiality of this discharge information lies with you and/or your care-partner.  

## 2021-04-15 NOTE — Op Note (Signed)
[facility] Patient Name: Andrea Wright Procedure Date: 04/15/2021 9:06 AM MRN: [id number] Endoscopist: Tressia DanasKimberly Jennavie Martinek MD, MD Age: [age] Referring MD:  Date of Birth: 05/31/1966 Gender: Female Account #: [id number] Procedure:                Colonoscopy Indications:              Screening for colorectal malignant neoplasm                           History of subtotal colectomy in 1998 for colonic                            intertia                           Father recently diagnosed with colon cancer in his                            5650s                           Mother with colon polyps Medicines:                Monitored Anesthesia Care Procedure:                Pre-Anesthesia Assessment:                           - Prior to the procedure, a History and Physical                            was performed, and patient medications and                            allergies were reviewed. The patient's tolerance of                            previous anesthesia was also reviewed. The risks                            and benefits of the procedure and the sedation                            options and risks were discussed with the patient.                            All questions were answered, and informed consent                            was obtained. Prior Anticoagulants: The patient has                            taken no previous anticoagulant or antiplatelet                            agents. ASA Grade Assessment: II -  A patient with                            mild systemic disease. After reviewing the risks                            and benefits, the patient was deemed in                            satisfactory condition to undergo the procedure.                           After obtaining informed consent, the colonoscope                            was passed under direct vision. Throughout the                            procedure, the patient's blood pressure, pulse, and                             oxygen saturations were monitored continuously. The                            Olympus PCF-H190DL (#[id number]) Colonoscope was                            introduced through the anus and advanced to the 8                            cm into the ileum. The colonoscopy was performed                            without difficulty. The patient tolerated the                            procedure well. The quality of the bowel                            preparation was good. The terminal ileum and the                            rectum were photographed. Scope In: 9:13:13 AM Scope Out: 9:19:32 AM Scope Withdrawal Time: 0 hours 5 minutes 35 seconds  Total Procedure Duration: 0 hours 6 minutes 19 seconds  Findings:                 Non-bleeding external and internal hemorrhoids were                            found.                           There was evidence of a prior end-to-end  ileo-colonic anastomosis in the sigmoid colon, 30                            cm from the anal verge. This was widely patent and                            was characterized by focal erosion and erythema                            adjacent to the anastomosis. The anastomosis was                            easily traversed. Biopsies were taken with a cold                            forceps for histology. Estimated blood loss was                            minimal.                           The exam was otherwise without abnormality on                            direct and retroflexion views. No polyps seen. Complications:            No immediate complications. Estimated blood loss:                            Minimal. Estimated Blood Loss:     Estimated blood loss was minimal. Impression:               - Non-bleeding external and internal hemorrhoids.                           - Subtotal colectomy anatomy.                           - Terminal ileum erythema and erosion.  Biopsied. Recommendation:           - Patient has a contact number available for                            emergencies. The signs and symptoms of potential                            delayed complications were discussed with the                            patient. Return to normal activities tomorrow.                            Written discharge instructions were provided to the                            patient.                           -  Resume previous diet.                           - Continue present medications.                           - Await pathology results.                           - Repeat colonoscopy in 5 years for surveillance                            given the family history.                           - Emerging evidence supports eating a diet of                            fruits, vegetables, grains, calcium, and yogurt                            while reducing red meat and alcohol may reduce the                            risk of colon cancer.                           - Thank you for allowing me to be involved in your                            colon cancer prevention. Tressia Danas MD, MD 04/15/2021 9:25:25 AM This report has been signed electronically.

## 2021-04-15 NOTE — Progress Notes (Signed)
VS-KW  Pt's states no medical or surgical changes since previsit or office visit.  

## 2021-04-17 ENCOUNTER — Telehealth: Payer: Self-pay | Admitting: *Deleted

## 2021-04-17 NOTE — Telephone Encounter (Signed)
  Follow up Call-  Call back number 04/15/2021  Post procedure Call Back phone  # [phone]  Permission to leave phone message Yes  Some recent data might be hidden     Patient questions:  Do you have a fever, pain , or abdominal swelling? No. Pain Score  0 *  Have you tolerated food without any problems? Yes.    Have you been able to return to your normal activities? Yes.    Do you have any questions about your discharge instructions: Diet   No. Medications  No. Follow up visit  No.  Do you have questions or concerns about your Care? No.  Actions: * If pain score is 4 or above: 1. No action needed, pain <4.Have you developed a fever since your procedure? no  2.   Have you had an respiratory symptoms (SOB or cough) since your procedure? no  3.   Have you tested positive for COVID 19 since your procedure no  4.   Have you had any family members/close contacts diagnosed with the COVID 19 since your procedure?  no   If yes to any of these questions please route to Laverna Peace, RN and Karlton Lemon, RN

## 2021-08-11 DIAGNOSIS — H524 Presbyopia: Secondary | ICD-10-CM | POA: Diagnosis not present

## 2021-10-16 DIAGNOSIS — L821 Other seborrheic keratosis: Secondary | ICD-10-CM | POA: Diagnosis not present

## 2021-10-16 DIAGNOSIS — L219 Seborrheic dermatitis, unspecified: Secondary | ICD-10-CM | POA: Diagnosis not present

## 2021-10-16 DIAGNOSIS — L814 Other melanin hyperpigmentation: Secondary | ICD-10-CM | POA: Diagnosis not present

## 2021-10-16 DIAGNOSIS — L578 Other skin changes due to chronic exposure to nonionizing radiation: Secondary | ICD-10-CM | POA: Diagnosis not present

## 2021-10-31 DIAGNOSIS — R350 Frequency of micturition: Secondary | ICD-10-CM | POA: Diagnosis not present

## 2021-10-31 DIAGNOSIS — Z124 Encounter for screening for malignant neoplasm of cervix: Secondary | ICD-10-CM | POA: Diagnosis not present

## 2021-10-31 DIAGNOSIS — Z1231 Encounter for screening mammogram for malignant neoplasm of breast: Secondary | ICD-10-CM | POA: Diagnosis not present

## 2021-10-31 DIAGNOSIS — Z01419 Encounter for gynecological examination (general) (routine) without abnormal findings: Secondary | ICD-10-CM | POA: Diagnosis not present

## 2021-10-31 DIAGNOSIS — Z01411 Encounter for gynecological examination (general) (routine) with abnormal findings: Secondary | ICD-10-CM | POA: Diagnosis not present

## 2021-10-31 DIAGNOSIS — Z113 Encounter for screening for infections with a predominantly sexual mode of transmission: Secondary | ICD-10-CM | POA: Diagnosis not present

## 2021-11-12 DIAGNOSIS — Z780 Asymptomatic menopausal state: Secondary | ICD-10-CM | POA: Diagnosis not present

## 2021-11-12 DIAGNOSIS — M810 Age-related osteoporosis without current pathological fracture: Secondary | ICD-10-CM | POA: Diagnosis not present

## 2021-11-12 DIAGNOSIS — M85852 Other specified disorders of bone density and structure, left thigh: Secondary | ICD-10-CM | POA: Diagnosis not present

## 2021-11-12 DIAGNOSIS — M85851 Other specified disorders of bone density and structure, right thigh: Secondary | ICD-10-CM | POA: Diagnosis not present

## 2021-12-11 DIAGNOSIS — M810 Age-related osteoporosis without current pathological fracture: Secondary | ICD-10-CM | POA: Diagnosis not present

## 2022-03-06 DIAGNOSIS — E559 Vitamin D deficiency, unspecified: Secondary | ICD-10-CM | POA: Diagnosis not present

## 2022-03-06 DIAGNOSIS — D649 Anemia, unspecified: Secondary | ICD-10-CM | POA: Diagnosis not present

## 2022-03-06 DIAGNOSIS — E7800 Pure hypercholesterolemia, unspecified: Secondary | ICD-10-CM | POA: Diagnosis not present

## 2022-03-16 DIAGNOSIS — R1900 Intra-abdominal and pelvic swelling, mass and lump, unspecified site: Secondary | ICD-10-CM | POA: Diagnosis not present

## 2022-03-16 DIAGNOSIS — Z0000 Encounter for general adult medical examination without abnormal findings: Secondary | ICD-10-CM | POA: Diagnosis not present

## 2022-03-16 DIAGNOSIS — E559 Vitamin D deficiency, unspecified: Secondary | ICD-10-CM | POA: Diagnosis not present

## 2022-03-16 DIAGNOSIS — E7800 Pure hypercholesterolemia, unspecified: Secondary | ICD-10-CM | POA: Diagnosis not present

## 2022-03-19 ENCOUNTER — Other Ambulatory Visit: Payer: Self-pay | Admitting: Family Medicine

## 2022-03-19 DIAGNOSIS — R1900 Intra-abdominal and pelvic swelling, mass and lump, unspecified site: Secondary | ICD-10-CM

## 2022-03-27 ENCOUNTER — Ambulatory Visit
Admission: RE | Admit: 2022-03-27 | Discharge: 2022-03-27 | Disposition: A | Discharge status: Home / Self Care | Payer: Medicare Other | Source: Ambulatory Visit | Attending: Family Medicine | Admitting: Family Medicine

## 2022-03-27 DIAGNOSIS — R1900 Intra-abdominal and pelvic swelling, mass and lump, unspecified site: Secondary | ICD-10-CM

## 2022-03-27 DIAGNOSIS — R1903 Right lower quadrant abdominal swelling, mass and lump: Secondary | ICD-10-CM | POA: Diagnosis not present

## 2022-07-16 DIAGNOSIS — Z135 Encounter for screening for eye and ear disorders: Secondary | ICD-10-CM | POA: Diagnosis not present

## 2022-07-16 DIAGNOSIS — H52223 Regular astigmatism, bilateral: Secondary | ICD-10-CM | POA: Diagnosis not present

## 2022-07-16 DIAGNOSIS — H5213 Myopia, bilateral: Secondary | ICD-10-CM | POA: Diagnosis not present

## 2022-07-16 DIAGNOSIS — H524 Presbyopia: Secondary | ICD-10-CM | POA: Diagnosis not present

## 2022-08-19 DIAGNOSIS — H5213 Myopia, bilateral: Secondary | ICD-10-CM | POA: Diagnosis not present

## 2022-10-15 DIAGNOSIS — D225 Melanocytic nevi of trunk: Secondary | ICD-10-CM | POA: Diagnosis not present

## 2022-10-15 DIAGNOSIS — L821 Other seborrheic keratosis: Secondary | ICD-10-CM | POA: Diagnosis not present

## 2022-10-15 DIAGNOSIS — L578 Other skin changes due to chronic exposure to nonionizing radiation: Secondary | ICD-10-CM | POA: Diagnosis not present

## 2022-10-15 DIAGNOSIS — D485 Neoplasm of uncertain behavior of skin: Secondary | ICD-10-CM | POA: Diagnosis not present

## 2022-10-15 DIAGNOSIS — C44219 Basal cell carcinoma of skin of left ear and external auricular canal: Secondary | ICD-10-CM | POA: Diagnosis not present

## 2022-10-15 DIAGNOSIS — L814 Other melanin hyperpigmentation: Secondary | ICD-10-CM | POA: Diagnosis not present

## 2022-11-04 DIAGNOSIS — C44219 Basal cell carcinoma of skin of left ear and external auricular canal: Secondary | ICD-10-CM | POA: Diagnosis not present

## 2022-11-16 DIAGNOSIS — Z01419 Encounter for gynecological examination (general) (routine) without abnormal findings: Secondary | ICD-10-CM | POA: Diagnosis not present

## 2022-11-16 DIAGNOSIS — Z01411 Encounter for gynecological examination (general) (routine) with abnormal findings: Secondary | ICD-10-CM | POA: Diagnosis not present

## 2022-11-16 DIAGNOSIS — Z1231 Encounter for screening mammogram for malignant neoplasm of breast: Secondary | ICD-10-CM | POA: Diagnosis not present

## 2022-11-16 DIAGNOSIS — Z113 Encounter for screening for infections with a predominantly sexual mode of transmission: Secondary | ICD-10-CM | POA: Diagnosis not present

## 2022-12-02 DIAGNOSIS — Z85828 Personal history of other malignant neoplasm of skin: Secondary | ICD-10-CM | POA: Diagnosis not present

## 2022-12-02 DIAGNOSIS — Z5189 Encounter for other specified aftercare: Secondary | ICD-10-CM | POA: Diagnosis not present

## 2023-01-15 DIAGNOSIS — Z681 Body mass index (BMI) 19 or less, adult: Secondary | ICD-10-CM | POA: Diagnosis not present

## 2023-01-15 DIAGNOSIS — M7651 Patellar tendinitis, right knee: Secondary | ICD-10-CM | POA: Diagnosis not present

## 2023-02-23 DIAGNOSIS — K080 Exfoliation of teeth due to systemic causes: Secondary | ICD-10-CM | POA: Diagnosis not present

## 2023-03-18 DIAGNOSIS — E559 Vitamin D deficiency, unspecified: Secondary | ICD-10-CM | POA: Diagnosis not present

## 2023-03-18 DIAGNOSIS — D649 Anemia, unspecified: Secondary | ICD-10-CM | POA: Diagnosis not present

## 2023-03-18 DIAGNOSIS — E7800 Pure hypercholesterolemia, unspecified: Secondary | ICD-10-CM | POA: Diagnosis not present

## 2023-03-22 DIAGNOSIS — F5000 Anorexia nervosa, unspecified: Secondary | ICD-10-CM | POA: Diagnosis not present

## 2023-03-22 DIAGNOSIS — Z0000 Encounter for general adult medical examination without abnormal findings: Secondary | ICD-10-CM | POA: Diagnosis not present

## 2023-03-22 DIAGNOSIS — E46 Unspecified protein-calorie malnutrition: Secondary | ICD-10-CM | POA: Diagnosis not present

## 2023-03-22 DIAGNOSIS — Z681 Body mass index (BMI) 19 or less, adult: Secondary | ICD-10-CM | POA: Diagnosis not present

## 2023-05-07 DIAGNOSIS — L0100 Impetigo, unspecified: Secondary | ICD-10-CM | POA: Diagnosis not present

## 2023-05-07 DIAGNOSIS — L820 Inflamed seborrheic keratosis: Secondary | ICD-10-CM | POA: Diagnosis not present

## 2023-07-09 DIAGNOSIS — F330 Major depressive disorder, recurrent, mild: Secondary | ICD-10-CM | POA: Diagnosis not present

## 2023-07-26 DIAGNOSIS — H524 Presbyopia: Secondary | ICD-10-CM | POA: Diagnosis not present

## 2023-10-04 DIAGNOSIS — K080 Exfoliation of teeth due to systemic causes: Secondary | ICD-10-CM | POA: Diagnosis not present

## 2023-10-21 DIAGNOSIS — D225 Melanocytic nevi of trunk: Secondary | ICD-10-CM | POA: Diagnosis not present

## 2023-10-21 DIAGNOSIS — L578 Other skin changes due to chronic exposure to nonionizing radiation: Secondary | ICD-10-CM | POA: Diagnosis not present

## 2023-10-21 DIAGNOSIS — L570 Actinic keratosis: Secondary | ICD-10-CM | POA: Diagnosis not present

## 2023-10-21 DIAGNOSIS — L821 Other seborrheic keratosis: Secondary | ICD-10-CM | POA: Diagnosis not present

## 2023-10-25 DIAGNOSIS — K080 Exfoliation of teeth due to systemic causes: Secondary | ICD-10-CM | POA: Diagnosis not present

## 2023-11-25 DIAGNOSIS — Z1231 Encounter for screening mammogram for malignant neoplasm of breast: Secondary | ICD-10-CM | POA: Diagnosis not present

## 2023-11-25 DIAGNOSIS — Z1331 Encounter for screening for depression: Secondary | ICD-10-CM | POA: Diagnosis not present

## 2023-11-25 DIAGNOSIS — Z01419 Encounter for gynecological examination (general) (routine) without abnormal findings: Secondary | ICD-10-CM | POA: Diagnosis not present

## 2023-12-23 DIAGNOSIS — L570 Actinic keratosis: Secondary | ICD-10-CM | POA: Diagnosis not present

## 2023-12-23 DIAGNOSIS — L89159 Pressure ulcer of sacral region, unspecified stage: Secondary | ICD-10-CM | POA: Diagnosis not present

## 2024-01-13 DIAGNOSIS — R1012 Left upper quadrant pain: Secondary | ICD-10-CM | POA: Diagnosis not present

## 2024-01-26 DIAGNOSIS — M8588 Other specified disorders of bone density and structure, other site: Secondary | ICD-10-CM | POA: Diagnosis not present

## 2024-01-26 DIAGNOSIS — N958 Other specified menopausal and perimenopausal disorders: Secondary | ICD-10-CM | POA: Diagnosis not present

## 2024-01-26 DIAGNOSIS — M810 Age-related osteoporosis without current pathological fracture: Secondary | ICD-10-CM | POA: Diagnosis not present

## 2024-02-21 DIAGNOSIS — L89159 Pressure ulcer of sacral region, unspecified stage: Secondary | ICD-10-CM | POA: Diagnosis not present

## 2024-02-23 DIAGNOSIS — M8580 Other specified disorders of bone density and structure, unspecified site: Secondary | ICD-10-CM | POA: Diagnosis not present

## 2024-03-27 DIAGNOSIS — E559 Vitamin D deficiency, unspecified: Secondary | ICD-10-CM | POA: Diagnosis not present

## 2024-03-27 DIAGNOSIS — D649 Anemia, unspecified: Secondary | ICD-10-CM | POA: Diagnosis not present

## 2024-03-27 DIAGNOSIS — E7800 Pure hypercholesterolemia, unspecified: Secondary | ICD-10-CM | POA: Diagnosis not present

## 2024-03-27 DIAGNOSIS — F5000 Anorexia nervosa, unspecified: Secondary | ICD-10-CM | POA: Diagnosis not present

## 2024-04-03 DIAGNOSIS — E7800 Pure hypercholesterolemia, unspecified: Secondary | ICD-10-CM | POA: Diagnosis not present

## 2024-04-03 DIAGNOSIS — M545 Low back pain, unspecified: Secondary | ICD-10-CM | POA: Diagnosis not present

## 2024-04-03 DIAGNOSIS — Z0000 Encounter for general adult medical examination without abnormal findings: Secondary | ICD-10-CM | POA: Diagnosis not present

## 2024-04-03 DIAGNOSIS — D649 Anemia, unspecified: Secondary | ICD-10-CM | POA: Diagnosis not present

## 2024-05-17 DIAGNOSIS — K080 Exfoliation of teeth due to systemic causes: Secondary | ICD-10-CM | POA: Diagnosis not present

## 2024-05-23 DIAGNOSIS — Z0000 Encounter for general adult medical examination without abnormal findings: Secondary | ICD-10-CM | POA: Diagnosis not present

## 2024-07-03 DIAGNOSIS — L89159 Pressure ulcer of sacral region, unspecified stage: Secondary | ICD-10-CM | POA: Diagnosis not present

## 2024-07-31 DIAGNOSIS — H52223 Regular astigmatism, bilateral: Secondary | ICD-10-CM | POA: Diagnosis not present

## 2024-07-31 DIAGNOSIS — H5213 Myopia, bilateral: Secondary | ICD-10-CM | POA: Diagnosis not present

## 2024-08-15 DIAGNOSIS — R140 Abdominal distension (gaseous): Secondary | ICD-10-CM | POA: Diagnosis not present

## 2024-08-15 DIAGNOSIS — Z681 Body mass index (BMI) 19 or less, adult: Secondary | ICD-10-CM | POA: Diagnosis not present

## 2024-08-15 DIAGNOSIS — Z9049 Acquired absence of other specified parts of digestive tract: Secondary | ICD-10-CM | POA: Diagnosis not present

## 2024-09-09 ENCOUNTER — Ambulatory Visit (HOSPITAL_COMMUNITY)
Admission: RE | Admit: 2024-09-09 | Discharge: 2024-09-09 | Disposition: A | Discharge status: Home / Self Care | Source: Ambulatory Visit | Attending: Family Medicine | Admitting: Family Medicine

## 2024-09-09 ENCOUNTER — Ambulatory Visit (INDEPENDENT_AMBULATORY_CARE_PROVIDER_SITE_OTHER)

## 2024-09-09 ENCOUNTER — Other Ambulatory Visit: Payer: Self-pay

## 2024-09-09 ENCOUNTER — Encounter (HOSPITAL_COMMUNITY): Payer: Self-pay

## 2024-09-09 VITALS — BP 149/94 | HR 71 | Temp 97.8°F | Resp 20

## 2024-09-09 DIAGNOSIS — M7918 Myalgia, other site: Secondary | ICD-10-CM | POA: Diagnosis not present

## 2024-09-09 NOTE — Discharge Instructions (Signed)
 I would recommend making a follow up appointment with your primary provider.  Your x-rays did not show any broken bones today.

## 2024-09-09 NOTE — ED Triage Notes (Signed)
 [facility] September 6. Patient feels stiffness and tightness. In right buttocks.  Patient has a wound on right forearm.  Reports she has not seen a provider for injuries.  Denies taking any medications for symptoms.    Patient reports she was walking, ' pivoted and lost balance is how she fell

## 2024-09-11 ENCOUNTER — Ambulatory Visit (HOSPITAL_COMMUNITY): Payer: Self-pay

## 2024-09-13 NOTE — ED Provider Notes (Signed)
 Andrea Wright CARE Wright   [id number] 09/09/24 Arrival Time: 1444  ASSESSMENT & PLAN:  1. Pain in right buttock     I have personally viewed and independently interpreted the imaging studies ordered this visit. R HIP/PELVIS: no acute bony changes.  Prefers OTC analgesics. WBAT.  May f/u as needed.   Reviewed expectations re: course of current medical issues. Questions answered. Outlined signs and symptoms indicating need for more acute intervention. Patient verbalized understanding. After Visit Summary given.  SUBJECTIVE: History from: patient. Andrea Wright is a 58 y.o. female who reports fall on 08/26/24. Still sore over my butt on R. Normal ambulation. Denies extremity sensation changes or weakness. Denies bowel/bladder habit changes. No tx PTA.   Past Surgical History:  Procedure Laterality Date   CERVICAL FUSION     c5-c6   COLECTOMY     1998   SPINE SURGERY        OBJECTIVE:  Vitals:   09/09/24 1544  BP: (!) 149/94  Pulse: 71  Resp: 20  Temp: 97.8 F (36.6 C)  TempSrc: Oral  SpO2: 98%    General appearance: alert; no distress HEENT: [place]; AT Neck: supple with FROM Resp: unlabored respirations Extremities: RLE: warm with well perfused appearance; vague TTP over mid R buttock and over R hip; FROM CV: brisk extremity capillary refill of RLE; 2+ DP pulse of RLE. Skin: warm and dry; no visible rashes Neurologic: gait normal; normal sensation and strength of RLE Psychological: alert and cooperative; normal mood and affect  Imaging: DG Hip Unilat W or Wo Pelvis 1 View Right Result Date: 09/09/2024 CLINICAL DATA:  Fall 08/26/2024. EXAM: DG HIP (WITH OR WITHOUT PELVIS) 1V RIGHT COMPARISON:  None Available. FINDINGS: There is no evidence of hip fracture or dislocation. There is no evidence of arthropathy or other focal bone abnormality. Moderate fecal retention over the rectum. IMPRESSION: No acute findings. Electronically Signed   By: [name] M.D.   On:  09/09/2024 16:49       Allergies  Allergen Reactions   Ambien [Zolpidem Tartrate] Other (See Comments)    SLEEP WALK, HALLUCINATE   Lunesta [Eszopiclone] Other (See Comments)    HALLUCINATIONS    Past Medical History:  Diagnosis Date   Anorexia    Anxiety    Depression    Eating disorder    GERD (gastroesophageal reflux disease)    Osteoporosis    Social History   Socioeconomic History   Marital status: Single    Spouse name: Not on file   Number of children: 0   Years of education: Master's in Business   Highest education level: Not on file  Occupational History   Occupation: retired     Comment: [employer]  Tobacco Use   Smoking status: Never   Smokeless tobacco: Never  Vaping Use   Vaping status: Never Used  Substance and Sexual Activity   Alcohol use: No   Drug use: No   Sexual activity: Not on file  Other Topics Concern   Not on file  Social History Narrative   Lives alone.   Sister lives in [place], [place].   Parents live in [place], [place].   Social Drivers of Corporate investment banker Strain: Not on file  Food Insecurity: Not on file  Transportation Needs: Not on file  Physical Activity: Not on file  Stress: Not on file  Social Connections: Not on file   Family History  Problem Relation Age of Onset   Hypertension Mother  Hypertension Father    Colon cancer Father    Stroke Paternal Grandmother    Heart attack Paternal Grandfather    Esophageal cancer Neg Hx    Stomach cancer Neg Hx    Rectal cancer Neg Hx    Past Surgical History:  Procedure Laterality Date   CERVICAL FUSION     c5-c6   COLECTOMY     1998   SPINE SURGERY         Andrea Rogue, MD 09/13/24 1015

## 2024-09-20 DEATH — deceased
